# Patient Record
Sex: Male | Born: 1990
Health system: Southern US, Community
[De-identification: ages and names within clinical notes are randomized; demographics above are authoritative.]

## PROBLEM LIST (undated history)

## (undated) DIAGNOSIS — R569 Unspecified convulsions: Secondary | ICD-10-CM

## (undated) HISTORY — PX: FINGER SURGERY: SHX640

## (undated) HISTORY — PX: BRAIN SURGERY: SHX531

---
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
## 2010-06-20 ENCOUNTER — Emergency Department (HOSPITAL_COMMUNITY): Admission: EM | Admit: 2010-06-20 | Discharge: 2010-06-20 | Payer: Self-pay | Admitting: Emergency Medicine

## 2010-06-20 ENCOUNTER — Emergency Department (HOSPITAL_COMMUNITY): Admission: EM | Admit: 2010-06-20 | Discharge: 2010-06-20 | Payer: Self-pay | Admitting: Family Medicine

## 2010-08-30 ENCOUNTER — Emergency Department (HOSPITAL_COMMUNITY): Admission: EM | Admit: 2010-08-30 | Discharge: 2010-08-30 | Payer: Self-pay | Admitting: Emergency Medicine

## 2011-03-10 LAB — VALPROIC ACID LEVEL: Valproic Acid Lvl: 33.9 ug/mL — ABNORMAL LOW (ref 50.0–100.0)

## 2011-03-13 LAB — CBC
Hemoglobin: 13.5 g/dL (ref 13.0–17.0)
MCH: 27.5 pg (ref 26.0–34.0)
MCHC: 32.5 g/dL (ref 30.0–36.0)
MCV: 84.7 fL (ref 78.0–100.0)
RBC: 4.9 MIL/uL (ref 4.22–5.81)
RDW: 12.8 % (ref 11.5–15.5)

## 2011-03-13 LAB — COMPREHENSIVE METABOLIC PANEL
AST: 46 U/L — ABNORMAL HIGH (ref 0–37)
Albumin: 4.2 g/dL (ref 3.5–5.2)
Alkaline Phosphatase: 73 U/L (ref 39–117)
BUN: 16 mg/dL (ref 6–23)
CO2: 30 mEq/L (ref 19–32)
Calcium: 9.8 mg/dL (ref 8.4–10.5)
Chloride: 105 mEq/L (ref 96–112)
Creatinine, Ser: 0.7 mg/dL (ref 0.4–1.5)
GFR calc Af Amer: >60 mL/min (ref >60)
Glucose, Bld: 90 mg/dL (ref 70–99)
Potassium: 3.7 mEq/L (ref 3.5–5.1)
Total Bilirubin: 0.7 mg/dL (ref 0.3–1.2)
Total Protein: 7.9 g/dL (ref 6.0–8.3)

## 2011-03-13 LAB — DIFFERENTIAL
Basophils Absolute: 0 10*3/uL (ref 0.0–0.1)
Monocytes Absolute: 0.5 10*3/uL (ref 0.1–1.0)
Monocytes Relative: 9 % (ref 3–12)
Neutro Abs: 3.5 10*3/uL (ref 1.7–7.7)
Neutrophils Relative %: 70 % (ref 43–77)

## 2011-03-13 LAB — URINALYSIS, ROUTINE W REFLEX MICROSCOPIC
Bilirubin Urine: NEGATIVE
Ketones, ur: NEGATIVE mg/dL
Urobilinogen, UA: 1 mg/dL (ref 0.0–1.0)
pH: 7 (ref 5.0–8.0)

## 2011-03-13 LAB — POCT I-STAT, CHEM 8
Chloride: 104 mEq/L (ref 96–112)
Sodium: 139 mEq/L (ref 135–145)

## 2011-03-13 LAB — LIPASE, BLOOD: Lipase: 23 U/L (ref 11–59)

## 2011-05-10 NOTE — Discharge Summary (Signed)
Joseph [name], Joseph [name] NO.:  [id number]   MEDICAL RECORD NO.:  [id number]          PATIENT TYPE:  OBV   LOCATION:  6151                         FACILITY:  [facility]   PHYSICIAN:  Henrietta Hoover, MD    DATE OF BIRTH:  1991-10-18   DATE OF ADMISSION:  05/11/2008  DATE OF DISCHARGE:  05/12/2008                               DISCHARGE SUMMARY   DISCHARGE DIAGNOSES:  1. Generalized seizure disorder.  2. Asperger syndrome.  3. Attention-deficit hyperactivity disorder.  4. Psoriasis.   REASON FOR HOSPITALIZATION:  Breakthrough seizures.   SIGNIFICANT FINDINGS:  This is a 20 year old male with history of  generalized seizure disorder, who presents with repeat generalized  seizure activity x2.  CMP within normal limits.  CBC with a white blood  cell 7.9, hemoglobin 12.8, platelets 168, neutrophils 82%, and  lymphocytes 14%.  Urine drug screen negative.  Valproic acid level 49.  Of note, the patient has history of positive HIV ELISA, but upon  discussion with PCP, viral load checked was 0 and repeat ELISA was  negative.   TREATMENT:  1. Observation.  2. IV fluids.  3. Consult with Dr. Sharene Skeans.   PROCEDURES:  None.   DISCHARGE MEDICATIONS:  1. Depakote 750 mg b.i.d. p.o.  This is an increase from previous      regimen of 625 b.i.d.  2. Triamcinolone cream.   PENDING ISSUES:  New Depakote level to be checked in a couple of days.   FOLLOWUP:  The patient will follow up with Dr. Sharene Skeans at previously  scheduled appointment on May 15, 2008.   DISCHARGE WEIGHT:  68 kg.   DISCHARGE CONDITION:  Improved and stable.      Pediatrics Resident      Henrietta Hoover, MD  Electronically Signed    PR/MEDQ  D:  05/12/2008  T:  05/13/2008  Job:  [id number]   cc:   Dallie Piles, MD  Deanna Artis. Sharene Skeans, M.D.

## 2011-05-13 NOTE — Discharge Summary (Signed)
Joseph [name], JACOME NO.:  [id number]   MEDICAL RECORD NO.:  [id number]          PATIENT TYPE:  INP   LOCATION:  6121                         FACILITY:  [facility]   PHYSICIAN:  Henrietta Hoover, MD    DATE OF BIRTH:  21-Apr-1991   DATE OF ADMISSION:  12/27/2006  DATE OF DISCHARGE:  12/30/2006                               DISCHARGE SUMMARY   PRIMARY CARE PHYSICIAN:  Dr. Obie Dredge, [facility].   REASON FOR HOSPITALIZATION:  [age]-year-old with seizure disorder,  Asperger, psoriasis, ADHD with a 2 week history of cough, weight loss  and mental status changes.   SIGNIFICANT FINDINGS:  White blood cell count 9.3, hemoglobin on  admission 10.0 and 9.6 at discharge with a hematocrit of 30.3, platelets  419, 89% PMNs on admission, MCV 77.1.  CMP within normal limits, except  for an albumin of 2.5.  Depakote level, on admission is 36, which is  low.  Chest x-ray with left-sided lingular and lower lobe infiltrate.  Ferritin 323, transferrin 180, iron 35, TIBC 222, percent saturation  16%, PPD negative, reticular site 1.4.   TREATMENT:  Ceftriaxone IV for pneumonia.  Diet with double sized  portions.  Multivitamin and continued his home Adderall, Depakote and  triamcinolone.   OPERATION/PROCEDURE:  None.   FINAL DIAGNOSES:  1. Lobar pneumonia.  2. Macrocytic anemia presumptive iron deficiency versus      malnourishment.  3. Malnutrition.  4. Asperger.  5. Psoriasis.  6. Attention deficit hyperactivity disorder.   DISCHARGE MEDICATIONS AND INSTRUCTIONS:  1. Adderall XR 10 mg p.o. daily.  2. Depakote 625 mg p.o. b.i.d.  3. Triamcinolone 0.1% cream 1 application daily.  4. Amoxicillin 500 mg p.o. t.i.d. x8 days (stop January 05, 2007).  5. Ferrous sulfate 325 mg p.o. b.i.d. with meals.   PENDING ISSUES AND RESULTS TO BE FOLLOWED:  Patient will need to have  his macrocytic anemia followed up with a CBC in 2 weeks and potentially  an anemia workup if it is  truly iron deficiency anemia.   FOLLOWUP:  Dr. Obie Dredge at 1:30 p.m. on Monday, January 01, 2007.   DISCHARGE WEIGHT:  52 kilograms.   DISCHARGE CONDITION:  Improved.     ______________________________  Lupita Raider, M.D.    ______________________________  Henrietta Hoover, MD    KS/MEDQ  D:  12/30/2006  T:  12/30/2006  Job:  [id number]   cc:   Dallie Piles, MD

## 2011-09-21 ENCOUNTER — Other Ambulatory Visit: Payer: Self-pay

## 2011-09-21 ENCOUNTER — Emergency Department (HOSPITAL_BASED_OUTPATIENT_CLINIC_OR_DEPARTMENT_OTHER)
Admission: EM | Admit: 2011-09-21 | Discharge: 2011-09-21 | Disposition: A | Discharge status: Home / Self Care | Payer: Medicaid Other | Attending: Emergency Medicine | Admitting: Emergency Medicine

## 2011-09-21 ENCOUNTER — Encounter: Payer: Self-pay | Admitting: *Deleted

## 2011-09-21 DIAGNOSIS — G40909 Epilepsy, unspecified, not intractable, without status epilepticus: Secondary | ICD-10-CM | POA: Insufficient documentation

## 2011-09-21 HISTORY — DX: Unspecified convulsions: R56.9

## 2011-09-21 LAB — COMPREHENSIVE METABOLIC PANEL
AST: 33
Alkaline Phosphatase: 142
BUN: 8
CO2: 25
Calcium: 9
Creatinine, Ser: 0.62
Glucose, Bld: 99
Potassium: 4
Sodium: 135

## 2011-09-21 LAB — DIFFERENTIAL
Basophils Absolute: 0
Eosinophils Absolute: 0
Lymphocytes Relative: 14 — ABNORMAL LOW
Neutrophils Relative %: 82 — ABNORMAL HIGH

## 2011-09-21 LAB — RAPID URINE DRUG SCREEN, HOSP PERFORMED
Barbiturates: NOT DETECTED
Benzodiazepines: NOT DETECTED
Opiates: NOT DETECTED
Tetrahydrocannabinol: NOT DETECTED

## 2011-09-21 LAB — CBC
MCV: 81.3
RDW: 12.4
WBC: 7.9

## 2011-09-21 LAB — VALPROIC ACID LEVEL: Valproic Acid Lvl: 49 — ABNORMAL LOW

## 2011-09-21 MED ORDER — DIVALPROEX SODIUM 125 MG PO CPSP: 125.0000 mg | ORAL_CAPSULE | Freq: Two times a day (BID) | ORAL | Status: DC

## 2011-09-21 MED ORDER — DIVALPROEX SODIUM 250 MG PO DR TAB
250.0000 mg | DELAYED_RELEASE_TABLET | Freq: Once | ORAL | Status: DC
  Filled 2011-09-21: qty 1

## 2011-09-21 MED ORDER — DIVALPROEX SODIUM ER 250 MG PO TB24
250.0000 mg | ORAL_TABLET | Freq: Every day | ORAL | Status: DC
  Administered 2011-09-21: 250 mg via ORAL
  Filled 2011-09-21: qty 1

## 2011-09-21 NOTE — ED Notes (Signed)
I helped patient into gown, I took vitals and ecg., all with difficulty as patient was only concerned with his text messaging until nurse entered room.

## 2011-09-21 NOTE — ED Provider Notes (Signed)
History     CSN: [id number] Arrival date & time: 09/21/2011  9:03 AM  Chief Complaint  Patient presents with  . 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 The patient was apparently at school today and had one witnessed grand mal seizure. No loss of bladder or bowel. Patient is somnolent but arousable to voice and appropriate when answering questions. He does not recall the event. He otherwise states he's been in his normal state of health. He did miss his Depakote dose this morning but states he had had it yesterday and does take it regularly. He takes no other seizure medications per him. He does not hurt at this point in time.  Patient is a 20 y.o. male presenting with seizures.  Seizures  This is a recurrent problem. The current episode started less than 1 hour ago. The problem has been gradually improving. There was 1 seizure. Pertinent negatives include no confusion, no headaches, no chest pain, no cough, no nausea and no vomiting. Characteristics include rhythmic jerking. The episode was witnessed. The seizures did not continue in the ED. Possible causes include missed seizure meds. Possible causes do not include med or dosage change, sleep deprivation, recent illness or change in alcohol use. There has been no fever. There were no medications administered prior to arrival.    Past Medical History  Diagnosis Date  . Seizures     History reviewed. No pertinent past surgical history.  History reviewed. No pertinent family history.  History  Substance Use Topics  . Smoking status: Not on file  . Smokeless tobacco: Not on file  . Alcohol Use: No      Review of Systems  Review of Systems  Constitutional: Negative.  Negative for fever and chills.  HENT: Negative.   Eyes: Negative.  Negative for  discharge and redness.  Respiratory: Negative.  Negative for cough and shortness of breath.   Cardiovascular: Negative.  Negative for chest pain.  Gastrointestinal: Negative.  Negative for nausea, vomiting and abdominal pain.  Genitourinary: Negative.  Negative for hematuria.  Musculoskeletal: Negative.  Negative for back pain.  Skin: Negative.  Negative for color change and rash.  Neurological: Positive for seizures. Negative for syncope and headaches.  Hematological: Negative.  Negative for adenopathy.  Psychiatric/Behavioral: Negative.  Negative for confusion.  All other systems reviewed and are negative.    Allergies  Review of patient's allergies indicates no known allergies.  Home Medications   Current Outpatient Rx  Name Route Sig Dispense Refill  . DIVALPROEX SODIUM 125 MG PO CPSP Oral Take 125 mg by mouth 2 (two) times daily.        Physical Exam    BP 131/59  Pulse 68  Temp(Src) 97.6 F (36.4 C) (Oral)  Resp 20  SpO2 99%  Physical Exam  Constitutional: He is oriented to person, place, and time. He appears well-developed and well-nourished.  Non-toxic appearance. He does not have a sickly appearance.  HENT:  Head: Normocephalic and atraumatic.  Eyes: Conjunctivae, EOM and lids are normal. Pupils are equal, round, and reactive to light.  Neck: Trachea normal, normal range of motion and full passive range of motion without pain. Neck supple.  Cardiovascular: Normal rate, regular rhythm and normal heart sounds.   Pulmonary/Chest: Effort normal and breath sounds normal. No respiratory distress. He has no wheezes. He has no rales. He exhibits  no tenderness.  Abdominal: Soft. Normal appearance. He exhibits no distension. There is no tenderness. There is no rebound and no CVA tenderness.  Musculoskeletal: Normal range of motion.  Neurological: He is alert and oriented to person, place, and time. He has normal strength.       Patient is somnolent but arousable to voice  and appropriate when answering questions  Skin: Skin is warm, dry and intact. No rash noted.  Psychiatric: He has a normal mood and affect. His behavior is normal. Judgment and thought content normal.    ED Course  Procedures (including critical care time)   Labs Reviewed  VALPROIC ACID LEVEL   No results found.    Date: 09/21/2011  Rate: 81  Rhythm: normal sinus rhythm  QRS Axis: normal  Intervals: normal  ST/T Wave abnormalities: normal  Conduction Disutrbances:none  Narrative Interpretation:   Old EKG Reviewed: none available    MDM As patient has a known history of seizures I will check only a Depakote level to see if he is at a therapeutic level. Patient is otherwise alert appropriate and demonstrates no symptoms for infection to indicate meningitis or encephalitis. He is somewhat somnolent which would be expected in a postictal state.     Patient has been found to have a subtherapeutic Depakote level   today. It is unclear that the patient is truly taking his medication daily as directed. I will give him double his normal morning dose to start the loading process. Patient will be discharged with a fresh prescription for the next 2 weeks of this medication as well. As patient is alert and appropriate he'll be able to be discharged home.   Nat Christen, MD 09/21/11 1058

## 2011-09-21 NOTE — ED Notes (Signed)
Pt to room 9 by ems via stretcher, reporting witnessed seizure x 1 at school today. Pt is a/a/ox4, denies any pain or any c/o.

## 2013-11-28 ENCOUNTER — Emergency Department (HOSPITAL_COMMUNITY)
Admission: EM | Admit: 2013-11-28 | Discharge: 2013-11-28 | Disposition: A | Discharge status: Home / Self Care | Payer: Self-pay | Attending: Emergency Medicine | Admitting: Emergency Medicine

## 2013-11-28 ENCOUNTER — Emergency Department (HOSPITAL_COMMUNITY): Payer: Self-pay

## 2013-11-28 ENCOUNTER — Encounter (HOSPITAL_COMMUNITY): Payer: Self-pay | Admitting: Emergency Medicine

## 2013-11-28 DIAGNOSIS — R296 Repeated falls: Secondary | ICD-10-CM | POA: Insufficient documentation

## 2013-11-28 DIAGNOSIS — Y929 Unspecified place or not applicable: Secondary | ICD-10-CM | POA: Insufficient documentation

## 2013-11-28 DIAGNOSIS — R51 Headache: Secondary | ICD-10-CM | POA: Insufficient documentation

## 2013-11-28 DIAGNOSIS — R5381 Other malaise: Secondary | ICD-10-CM | POA: Insufficient documentation

## 2013-11-28 DIAGNOSIS — R569 Unspecified convulsions: Secondary | ICD-10-CM | POA: Insufficient documentation

## 2013-11-28 DIAGNOSIS — Z79899 Other long term (current) drug therapy: Secondary | ICD-10-CM | POA: Insufficient documentation

## 2013-11-28 DIAGNOSIS — R110 Nausea: Secondary | ICD-10-CM | POA: Insufficient documentation

## 2013-11-28 DIAGNOSIS — Y939 Activity, unspecified: Secondary | ICD-10-CM | POA: Insufficient documentation

## 2013-11-28 DIAGNOSIS — IMO0002 Reserved for concepts with insufficient information to code with codable children: Secondary | ICD-10-CM | POA: Insufficient documentation

## 2013-11-28 LAB — CBC WITH DIFFERENTIAL/PLATELET
Basophils Absolute: 0 10*3/uL (ref 0.0–0.1)
Eosinophils Absolute: 0.1 10*3/uL (ref 0.0–0.7)
MCV: 78.9 fL (ref 78.0–100.0)
Monocytes Relative: 7 % (ref 3–12)
Neutro Abs: 5.3 10*3/uL (ref 1.7–7.7)
Neutrophils Relative %: 74 % (ref 43–77)
Platelets: 228 10*3/uL (ref 150–400)

## 2013-11-28 LAB — POCT I-STAT, CHEM 8
BUN: 14 mg/dL (ref 6–23)
Calcium, Ion: 1.26 mmol/L — ABNORMAL HIGH (ref 1.12–1.23)
Creatinine, Ser: 1.1 mg/dL (ref 0.50–1.35)
Glucose, Bld: 87 mg/dL (ref 70–99)
HCT: 45 % (ref 39.0–52.0)
Hemoglobin: 15.3 g/dL (ref 13.0–17.0)
Potassium: 3.7 mEq/L (ref 3.5–5.1)
Sodium: 139 mEq/L (ref 135–145)
TCO2: 28 mmol/L (ref 0–100)

## 2013-11-28 LAB — VALPROIC ACID LEVEL: Valproic Acid Lvl: 64.3 ug/mL (ref 50.0–100.0)

## 2013-11-28 MED ORDER — ONDANSETRON HCL 4 MG/2ML IJ SOLN
4.0000 mg | Freq: Once | INTRAMUSCULAR | Status: AC
  Administered 2013-11-28: 4 mg via INTRAVENOUS
  Filled 2013-11-28: qty 2

## 2013-11-28 MED ORDER — SODIUM CHLORIDE 0.9 % IV BOLUS (SEPSIS)
1000.0000 mL | Freq: Once | INTRAVENOUS | Status: AC
  Administered 2013-11-28: 1000 mL via INTRAVENOUS

## 2013-11-28 NOTE — ED Notes (Addendum)
Spoke with Case manager on call about prescription assistant, case manager will call the pt tomorrow to evaluate if pt eligible for prescription assistant. Walker NP made aware.

## 2013-11-28 NOTE — ED Notes (Signed)
IV line flushed

## 2013-11-28 NOTE — ED Notes (Signed)
Pt walked around nurses station with no complaints. Pt given [place] sandwich and sprite

## 2013-11-28 NOTE — ED Notes (Addendum)
Pt states he tried to stretched his seizure medication out because he does not have money. Walker NP made aware, Dan Humphreys states to involve case Production designer, theatre/television/film, case manager will be notified by Risk manager.

## 2013-11-28 NOTE — ED Notes (Signed)
Pt arrives to ed via [facility] for seizure. Pt arrives with c-collar in place and on LSB.  Pt SP ictal. Caox4, pmsx4, no obvious injury or deformity.pt sts hx of seizures.

## 2013-11-28 NOTE — ED Notes (Signed)
Notified Isidoro Donning, Case Manager

## 2013-11-28 NOTE — ED Provider Notes (Signed)
Medical screening examination/treatment/procedure(s) were conducted as a shared visit with non-physician practitioner(s) and myself.  I personally evaluated the patient during the encounter.  EKG Interpretation   None       22 year old male with history of seizures presenting after a seizure. He states he fell and hit the back of his head. On exam, no head contusions but tenderness to the back of his head. He seemed slightly somnolent, which is likely a residual post ictal symptom.  Plan head CT, monitoring for continued improvement in mental status.  Clinical Impression: 1. Seizure       Candyce Churn, MD 11/29/13 [phone]

## 2013-11-28 NOTE — ED Provider Notes (Signed)
CSN: [id number]     Arrival date & time 11/28/13  1131 History   First MD Initiated Contact with Patient 11/28/13 1215     Chief Complaint  Patient presents with  . Seizures   (Consider location/radiation/quality/duration/timing/severity/associated sxs/prior Treatment) Patient is a 22 y.o. male presenting with seizures. The history is provided by the EMS personnel and the patient. No language interpreter was used.  Seizures Seizure activity on arrival: no   Postictal symptoms: somnolence    Pt is a 22 year old male who presents via [facility] after having a seizure today. Unsure if witnessed, pt doesn't recall details. Post-ictal and lethargic on arrival. No obvious injury or deformity, pt thinks he might have fallen. He reports a dull headache and some mild nausea. Denies back pain, neck pain or pain in his extremities. Denies any recent illness, fever or chills.   Past Medical History  Diagnosis Date  . Seizures    History reviewed. No pertinent past surgical history. History reviewed. No pertinent family history. History  Substance Use Topics  . Smoking status: Never Smoker   . Smokeless tobacco: Not on file  . Alcohol Use: No    Review of Systems  Constitutional: Negative for fever, chills and fatigue.  Respiratory: Negative for shortness of breath.   Cardiovascular: Negative for chest pain.  Gastrointestinal: Negative for abdominal pain.  Neurological: Positive for seizures and headaches. Negative for tremors, speech difficulty and numbness.  All other systems reviewed and are negative.    Allergies  Shellfish allergy  Home Medications   Current Outpatient Rx  Name  Route  Sig  Dispense  Refill  . divalproex (DEPAKOTE SPRINKLE) 125 MG capsule   Oral   Take 125 mg by mouth 2 (two) times daily.           Marland Kitchen EXPIRED: divalproex (DEPAKOTE SPRINKLES) 125 MG capsule   Oral   Take 1 capsule (125 mg total) by mouth 2 (two) times daily.   28 capsule   0    BP  134/47  Pulse 69  Temp(Src) 98.3 F (36.8 C) (Oral)  Resp 20  SpO2 100% Physical Exam  Nursing note and vitals reviewed. Constitutional: He is oriented to person, place, and time. He appears well-developed and well-nourished. He appears lethargic. No distress.  HENT:  Head: Normocephalic and atraumatic.  Mouth/Throat: Oropharynx is clear and moist.  Eyes: Conjunctivae and EOM are normal. Pupils are equal, round, and reactive to light.  Neck: Normal range of motion. Neck supple. No JVD present. No tracheal deviation present. No thyromegaly present.  Cardiovascular: Normal rate, regular rhythm, normal heart sounds and intact distal pulses.   Pulmonary/Chest: Effort normal and breath sounds normal. No respiratory distress. He has no wheezes.  Abdominal: Soft. Bowel sounds are normal. He exhibits no distension. There is no tenderness. There is no rebound and no guarding.  Musculoskeletal: Normal range of motion.  Lymphadenopathy:    He has no cervical adenopathy.  Neurological: He is oriented to person, place, and time. He has normal strength. He appears lethargic. No sensory deficit. GCS eye subscore is 4. GCS verbal subscore is 5. GCS motor subscore is 6.  Lethargic but responds appropriately and answers questions.   Skin: Skin is warm and dry.  Psychiatric: He has a normal mood and affect. His behavior is normal.    ED Course  Procedures (including critical care time) Labs Review Labs Reviewed  CBC WITH DIFFERENTIAL - Abnormal; Notable for the following:  MCH 25.7 (*)    All other components within normal limits  POCT I-STAT, CHEM 8 - Abnormal; Notable for the following:    Calcium, Ion 1.26 (*)    All other components within normal limits  VALPROIC ACID LEVEL   Imaging Review No results found.  EKG Interpretation   None       MDM   1. Seizure    Head CT no focal abnormality. Neuro exam intact. Complete recovery s/p seizure. Depakote level was therapeutic. Referred  to social worker for assistance with medications. Ambulatory in unit and eating and drinking. Pt encouraged to go home and rest and stay well hydrated today. Reminded to take medications with consistency. Pt feels ok to go home. Stable for discharge.     Irish Elders, NP 12/07/13 [phone]

## 2013-11-29 NOTE — Progress Notes (Signed)
   CARE MANAGEMENT NOTE 11/29/2013  Patient:  Joseph [name], Joseph [name]   Account Number:  [id number]  Date Initiated:  11/29/2013  Documentation initiated by:  [facility]  Subjective/Objective Assessment:   Seizures     Action/Plan:   Anticipated DC Date:  11/28/2013   Anticipated DC Plan:  HOME/SELF CARE      DC Planning Services  CM consult  Medication Assistance      Choice offered to / List presented to:             Status of service:  Completed, signed off Medicare Important Message given?   (If response is "NO", the following Medicare IM given date fields will be blank) Date Medicare IM given:   Date Additional Medicare IM given:    Discharge Disposition:  HOME/SELF CARE  Per UR Regulation:    If discussed at Long Length of Stay Meetings, dates discussed:    Comments:  11/29/2013 1000 NCM attempted call to pt's cell phone. Phone is currently not working. Contacted pt's home phone. Left message with person answering phone. States she will give pt my number to return call. Will verify with pt that he no longer has Medicaid. Did not see in notes Rx for medications. Isidoro Donning RN CCM Case Mgmt phone [phone]

## 2013-12-07 NOTE — ED Provider Notes (Addendum)
Medical screening examination/treatment/procedure(s) were conducted as a shared visit with non-physician practitioner(s) and myself.  I personally evaluated the patient during the encounter.   Please see my separate note.      Candyce Churn, MD 12/07/13 1212  Candyce Churn, MD 12/07/13 [phone]

## 2017-08-21 ENCOUNTER — Encounter (HOSPITAL_COMMUNITY): Payer: Self-pay | Admitting: Emergency Medicine

## 2017-08-21 ENCOUNTER — Emergency Department (HOSPITAL_COMMUNITY)
Admission: EM | Admit: 2017-08-21 | Discharge: 2017-08-21 | Disposition: A | Discharge status: Home / Self Care | Payer: Medicaid Other | Attending: Emergency Medicine | Admitting: Emergency Medicine

## 2017-08-21 DIAGNOSIS — Z79899 Other long term (current) drug therapy: Secondary | ICD-10-CM | POA: Insufficient documentation

## 2017-08-21 DIAGNOSIS — R569 Unspecified convulsions: Secondary | ICD-10-CM | POA: Insufficient documentation

## 2017-08-21 LAB — VALPROIC ACID LEVEL: Valproic Acid Lvl: 65 ug/mL (ref 50.0–100.0)

## 2017-08-21 MED ORDER — LEVETIRACETAM 500 MG PO TABS: 500.0000 mg | ORAL_TABLET | Freq: Two times a day (BID) | ORAL | 0 refills | Status: DC

## 2017-08-21 MED ORDER — LEVETIRACETAM 500 MG/5ML IV SOLN
1000.0000 mg | Freq: Once | INTRAVENOUS | Status: AC
  Administered 2017-08-21: 1000 mg via INTRAVENOUS
  Filled 2017-08-21: qty 10

## 2017-08-21 NOTE — ED Notes (Signed)
Bed: WA08 Expected date:  Expected time:  Means of arrival:  Comments: Ems-sz

## 2017-08-21 NOTE — ED Triage Notes (Signed)
Per EMS, patient from a parking lot, had witnessed seizure lasting approximately six minutes. Patient post ictal upon EMS arrival. Hx seizures.   18g L AC  BP 134/95 HR 90 RR 16 O2 96

## 2017-08-21 NOTE — ED Provider Notes (Signed)
[facility]-EMERGENCY DEPT Provider Note   CSN: [id number] Arrival date & time: 08/21/17  1630     History   Chief Complaint Chief Complaint  Patient presents with  . Seizures    HPI Joseph [name] is a 26 y.o. male.  HPI  26 y.o. male with a hx of Seizures, presents to the Emergency Department today via EMS due to witnessed seizure in parking lot PTA. This lasted approximately 6 minutes. Pt was post ictal on EMS arrival. Last seizure x 2 months ago. Pt takes Depakote 250mg  BID. Pt was told during last episode that he should increase his dosing, but patient refused due to not liking the way it makes him feel. Does not have a neurologist. Denies headaches. No numbness/tingling. No N/V. No CP/SOB/ABD pain. No visual changes. No cough/congestion. No other symptoms noted.   Past Medical History:  Diagnosis Date  . Seizures (HCC)     There are no active problems to display for this patient.   History reviewed. No pertinent surgical history.     Home Medications    Prior to Admission medications   Medication Sig Start Date End Date Taking? Authorizing Provider  divalproex (DEPAKOTE SPRINKLE) 125 MG capsule Take 250 mg by mouth 2 (two) times daily.    Yes [provider]  triamcinolone (KENALOG) 0.025 % cream Apply 1 application topically 2 (two) times daily.   Yes [provider]  divalproex (DEPAKOTE SPRINKLES) 125 MG capsule Take 1 capsule (125 mg total) by mouth 2 (two) times daily. 09/21/11 09/20/12  Emeline General, MD    Family History No family history on file.  Social History Social History  Substance Use Topics  . Smoking status: Never Smoker  . Smokeless tobacco: Not on file  . Alcohol use No     Allergies   Dust mite extract and Shellfish allergy   Review of Systems Review of Systems ROS reviewed and all are negative for acute change except as noted in the HPI.  Physical Exam Updated Vital Signs BP 135/63 (BP Location: Left Arm)   Pulse  80   Resp 14   SpO2 98%   Physical Exam  Constitutional: He is oriented to person, place, and time. Vital signs are normal. He appears well-developed and well-nourished.  HENT:  Head: Normocephalic and atraumatic.  Right Ear: Hearing normal.  Left Ear: Hearing normal.  Eyes: Pupils are equal, round, and reactive to light. Conjunctivae and EOM are normal.  Neck: Normal range of motion. Neck supple.  Cardiovascular: Normal rate, regular rhythm, normal heart sounds and intact distal pulses.   Pulmonary/Chest: Effort normal and breath sounds normal.  Musculoskeletal: Normal range of motion.  Neurological: He is alert and oriented to person, place, and time. He has normal strength. No cranial nerve deficit or sensory deficit.  Cranial Nerves:  II: Pupils equal, round, reactive to light III,IV, VI: ptosis not present, extra-ocular motions intact bilaterally  V,VII: smile symmetric, facial light touch sensation equal VIII: hearing grossly normal bilaterally  IX,X: midline uvula rise  XI: bilateral shoulder shrug equal and strong XII: midline tongue extension  Skin: Skin is warm and dry.  Psychiatric: He has a normal mood and affect. His speech is normal and behavior is normal. Thought content normal.  Nursing note and vitals reviewed.    ED Treatments / Results  Labs (all labs ordered are listed, but only abnormal results are displayed) Labs Reviewed  VALPROIC ACID LEVEL    EKG  EKG Interpretation None  Radiology No results found.  Procedures Procedures (including critical care time)  Medications Ordered in ED Medications  levETIRAcetam (KEPPRA) 1,000 mg in sodium chloride 0.9 % 100 mL IVPB (1,000 mg Intravenous New Bag/Given 08/21/17 1833)     Initial Impression / Assessment and Plan / ED Course  I have reviewed the triage vital signs and the nursing notes.  Pertinent labs & imaging results that were available during my care of the patient were reviewed by me  and considered in my medical decision making (see chart for details).  Final Clinical Impressions(s) / ED Diagnoses  {I have reviewed and evaluated the relevant laboratory values.   {I have reviewed the relevant previous healthcare records.  {I obtained HPI from historian. {Patient discussed with supervising physician.  ED Course:  Assessment: Pt is a 26 y.o. male with a hx of Seizures, presents to the Emergency Department today via EMS due to witnessed seizure in parking lot PTA. This lasted approximately 6 minutes. Pt was post ictal on EMS arrival. Last seizure x 2 months ago. Pt takes Depakote 250mg  BID. Pt was told during last episode that he should increase his dosing, but patient refused due to not liking the way it makes him feel. Does not have a neurologist. Denies headaches. No numbness/tingling. No N/V. No CP/SOB/ABD pain. No visual changes. No cough/congestion. On exam, pt in NAD. Nontoxic/nonseptic appearing. VSS. Afebrile. Lungs CTA. Heart RRR. Abdomen nontender soft. CN evaluated and unremarkable. Depakote Level 65. Given Keppra 1g Load in ED. Plan is to DC home with follow up to Neurology. Will Rx keppra 500mg  BID. Ambulatory referral to neurology given. At time of discharge, Patient is in no acute distress. Vital Signs are stable. Patient is able to ambulate. Patient able to tolerate PO.   Disposition/Plan:  DC Home Additional Verbal discharge instructions given and discussed with patient.  Pt Instructed to f/u with neurology in the next week for evaluation and treatment of symptoms. Return precautions given Pt acknowledges and agrees with plan  Supervising Physician Doug Sou, MD  Final diagnoses:  Seizure [facility])    New Prescriptions New Prescriptions   No medications on file     Wilber Bihari 08/21/17 1836    Doug Sou, MD 08/21/17 2227

## 2017-08-21 NOTE — ED Provider Notes (Addendum)
Patient reportedly had a generalized seizure earlier today. DENIES NONCOMPLIANCE WITH MEDICATION HOWEVER REPORTEDLY HE DOES NOT LIKE THE WAY DEPAKOTE MAKES HIM FEEL. ON EXAM PATIENT IS SLEEPY AROUSABLE TO VERBAL STIMULUS. MOVES ALL EXTREMITIES. [place]asc[place] SCORE 14    Doug Sou, MD 08/21/17 Guadlupe Spanish, MD 08/21/17 2227

## 2017-08-21 NOTE — Discharge Instructions (Signed)
Please read and follow all provided instructions.  Your diagnoses today include:  1. Seizure (HCC)     Tests performed today include: Vital signs. See below for your results today.   Medications prescribed:  Take as prescribed   Home care instructions:  Follow any educational materials contained in this packet.  Follow-up instructions: Please follow-up with your neurology for further evaluation of symptoms and treatment   Return instructions:  Please return to the Emergency Department if you do not get better, if you get worse, or new symptoms OR  - Fever (temperature greater than 101.9F)  - Bleeding that does not stop with holding pressure to the area    -Severe pain (please note that you may be more sore the day after your accident)  - Chest Pain  - Difficulty breathing  - Severe nausea or vomiting  - Inability to tolerate food and liquids  - Passing out  - Skin becoming red around your wounds  - Change in mental status (confusion or lethargy)  - New numbness or weakness    Please return if you have any other emergent concerns.  Additional Information:  Your vital signs today were: BP 135/63 (BP Location: Left Arm)    Pulse 80    Resp 14    SpO2 98%  If your blood pressure (BP) was elevated above 135/85 this visit, please have this repeated by your doctor within one month. ---------------

## 2017-08-21 NOTE — ED Notes (Signed)
IV removed from LAC 

## 2018-01-26 ENCOUNTER — Emergency Department (HOSPITAL_COMMUNITY)
Admission: EM | Admit: 2018-01-26 | Discharge: 2018-01-26 | Disposition: A | Discharge status: Home / Self Care | Payer: Self-pay | Attending: Emergency Medicine | Admitting: Emergency Medicine

## 2018-01-26 ENCOUNTER — Emergency Department (HOSPITAL_COMMUNITY): Payer: Self-pay

## 2018-01-26 ENCOUNTER — Encounter (HOSPITAL_COMMUNITY): Payer: Self-pay

## 2018-01-26 ENCOUNTER — Other Ambulatory Visit: Payer: Self-pay

## 2018-01-26 DIAGNOSIS — Z79899 Other long term (current) drug therapy: Secondary | ICD-10-CM | POA: Insufficient documentation

## 2018-01-26 DIAGNOSIS — R569 Unspecified convulsions: Secondary | ICD-10-CM | POA: Insufficient documentation

## 2018-01-26 DIAGNOSIS — M79642 Pain in left hand: Secondary | ICD-10-CM | POA: Insufficient documentation

## 2018-01-26 LAB — VALPROIC ACID LEVEL: Valproic Acid Lvl: 86 ug/mL (ref 50.0–100.0)

## 2018-01-26 LAB — COMPREHENSIVE METABOLIC PANEL
ALT: 22 U/L (ref 17–63)
AST: 23 U/L (ref 15–41)
Albumin: 3.9 g/dL (ref 3.5–5.0)
Alkaline Phosphatase: 50 U/L (ref 38–126)
Anion gap: 8 (ref 5–15)
BUN: 16 mg/dL (ref 6–20)
CO2: 28 mmol/L (ref 22–32)
CREATININE: 0.86 mg/dL (ref 0.61–1.24)
Calcium: 9.1 mg/dL (ref 8.9–10.3)
Chloride: 101 mmol/L (ref 101–111)
GFR calc non Af Amer: >60 mL/min (ref >60)
GLUCOSE: 92 mg/dL (ref 65–99)
Potassium: 4.1 mmol/L (ref 3.5–5.1)
SODIUM: 137 mmol/L (ref 135–145)
Total Bilirubin: 0.6 mg/dL (ref 0.3–1.2)
Total Protein: 7.3 g/dL (ref 6.5–8.1)

## 2018-01-26 NOTE — ED Notes (Signed)
Pt is alert and oriented x 4 and is verbally responsive. Pt appears sleepy. Pt is inquiring if employer was contacted as pt wants to return back to work. MD inquiry to commence.

## 2018-01-26 NOTE — ED Provider Notes (Signed)
[facility]-EMERGENCY DEPT Provider Note   CSN: [id number] Arrival date & time: 01/26/18  0941     History   Chief Complaint Chief Complaint  Patient presents with  . Seizures    HPI Joseph [name] is a 27 y.o. male.  HPI Patient is a 27 year old male with a known history of seizures for which he takes Depakote.  He states compliance with his seizures but had a witnessed seizure today.  He was found to be postictal by EMS and was brought to the emergency department.  On arrival to the emergency department the patient's mental status is clear.  He reports mild pain in left middle finger MCP joint without significant swelling.  Denies headache.  Denies neck pain.  No chest or abdominal pain.  Denies back pain.  No recent illness.  Symptoms are mild in severity.   Past Medical History:  Diagnosis Date  . Seizures (HCC)     There are no active problems to display for this patient.   History reviewed. No pertinent surgical history.     Home Medications    Prior to Admission medications   Medication Sig Start Date End Date Taking? Authorizing Provider  levETIRAcetam (KEPPRA) 500 MG tablet Take 1 tablet (500 mg total) by mouth 2 (two) times daily. 08/21/17  Yes Audry Pili, PA-C  divalproex (DEPAKOTE SPRINKLES) 125 MG capsule Take 1 capsule (125 mg total) by mouth 2 (two) times daily. Patient not taking: Reported on 01/26/2018 09/21/11 09/20/12  Emeline General, MD    Family History History reviewed. No pertinent family history.  Social History Social History   Tobacco Use  . Smoking status: Never Smoker  . Smokeless tobacco: Never Used  Substance Use Topics  . Alcohol use: No  . Drug use: No     Allergies   Dust mite extract; Shellfish allergy; and Tomato   Review of Systems Review of Systems  All other systems reviewed and are negative.    Physical Exam Updated Vital Signs Ht 6\' 5"  (1.956 m)   Wt 90.7 kg (200 lb)   SpO2 100%   BMI  23.72 kg/m   Physical Exam  Constitutional: He is oriented to person, place, and time. He appears well-developed and well-nourished.  HENT:  Head: Normocephalic and atraumatic.  Eyes: EOM are normal. Pupils are equal, round, and reactive to light.  Neck: Normal range of motion.  Cardiovascular: Normal rate and regular rhythm.  Pulmonary/Chest: Effort normal.  Abdominal: Soft. He exhibits no distension.  Musculoskeletal: Normal range of motion.  Mild tenderness left middle finger MCP joint without obvious deformity.  No swelling or erythema.  Full range of motion of the left middle finger.  Normal flexion and extension.  Neurological: He is alert and oriented to person, place, and time.  5/5 strength in major muscle groups of  bilateral upper and lower extremities. Speech normal. No facial asymetry.   Skin: Skin is warm.  Psychiatric: He has a normal mood and affect.  Nursing note and vitals reviewed.    ED Treatments / Results  Labs (all labs ordered are listed, but only abnormal results are displayed) Labs Reviewed  VALPROIC ACID LEVEL  COMPREHENSIVE METABOLIC PANEL    EKG  EKG Interpretation None       Radiology Dg Hand Complete Left  Result Date: 01/26/2018 CLINICAL DATA:  Seizure this morning with left hand and index finger pain. EXAM: LEFT HAND - COMPLETE 3+ VIEW COMPARISON:  None. FINDINGS: Overlap of fingers  on the lateral view. Given this limitation, no fracture or dislocation identified. IMPRESSION: No acute osseous abnormality. Electronically Signed   By: Jeronimo GreavesKyle  Talbot M.D.   On: 01/26/2018 10:43    Procedures Procedures (including critical care time)  Medications Ordered in ED Medications - No data to display   Initial Impression / Assessment and Plan / ED Course  I have reviewed the triage vital signs and the nursing notes.  Pertinent labs & imaging results that were available during my care of the patient were reviewed by me and considered in my medical  decision making (see chart for details).     Overall well-appearing.  No longer postictal.  Mental status is cleared.  Depakote level is therapeutic.  Outpatient primary care and neurology follow-up.  Patient understands return to the ER for new or worsening symptoms.  Final Clinical Impressions(s) / ED Diagnoses   Final diagnoses:  None    ED Discharge Orders    None       Azalia Bilisampos, Audrielle Vankuren, MD 01/26/18 1053

## 2018-01-26 NOTE — ED Triage Notes (Signed)
Patient brought in by EMS with witnessed seizure lasting approx 30 seconds, causing the patient to fall off the toilet, with post-ictal phase en route. Patient placed in C-collar by EMS for fall. 18G Left AC PIV placed by EMS. Patient Alert and oriented x4 in triage. EDMD at bedside- C-collar removed by EDMD.

## 2018-01-26 NOTE — ED Notes (Signed)
Bed: WA06 Expected date:  Expected time:  Means of arrival:  Comments: 27 yo seizure w/hx of the same

## 2018-01-26 NOTE — Discharge Instructions (Signed)
Please call your neurologist for follow up °

## 2019-07-22 ENCOUNTER — Other Ambulatory Visit: Payer: Self-pay

## 2019-07-22 ENCOUNTER — Emergency Department (HOSPITAL_COMMUNITY): Payer: Self-pay

## 2019-07-22 ENCOUNTER — Observation Stay (HOSPITAL_COMMUNITY)
Admission: EM | Admit: 2019-07-22 | Discharge: 2019-07-23 | Disposition: A | Discharge status: Home / Self Care | Payer: Self-pay | Attending: Family Medicine | Admitting: Family Medicine

## 2019-07-22 DIAGNOSIS — R569 Unspecified convulsions: Principal | ICD-10-CM | POA: Insufficient documentation

## 2019-07-22 DIAGNOSIS — E162 Hypoglycemia, unspecified: Secondary | ICD-10-CM | POA: Diagnosis present

## 2019-07-22 DIAGNOSIS — D649 Anemia, unspecified: Secondary | ICD-10-CM | POA: Diagnosis present

## 2019-07-22 DIAGNOSIS — Z79899 Other long term (current) drug therapy: Secondary | ICD-10-CM | POA: Insufficient documentation

## 2019-07-22 DIAGNOSIS — G40919 Epilepsy, unspecified, intractable, without status epilepticus: Secondary | ICD-10-CM

## 2019-07-22 DIAGNOSIS — Z20828 Contact with and (suspected) exposure to other viral communicable diseases: Secondary | ICD-10-CM | POA: Insufficient documentation

## 2019-07-22 DIAGNOSIS — Z114 Encounter for screening for human immunodeficiency virus [HIV]: Secondary | ICD-10-CM

## 2019-07-22 LAB — CBG MONITORING, ED
Glucose-Capillary: 48 mg/dL — ABNORMAL LOW (ref 70–99)
Glucose-Capillary: 59 mg/dL — ABNORMAL LOW (ref 70–99)
Glucose-Capillary: 184 mg/dL — ABNORMAL HIGH (ref 70–99)
Glucose-Capillary: 29 mg/dL — CL (ref 70–99)
Glucose-Capillary: 46 mg/dL — ABNORMAL LOW (ref 70–99)
Glucose-Capillary: 32 mg/dL — CL (ref 70–99)
Glucose-Capillary: 40 mg/dL — CL (ref 70–99)
Glucose-Capillary: 97 mg/dL (ref 70–99)
Glucose-Capillary: 81 mg/dL (ref 70–99)
Glucose-Capillary: 84 mg/dL (ref 70–99)

## 2019-07-22 LAB — VALPROIC ACID LEVEL: Valproic Acid Lvl: 21 ug/mL — ABNORMAL LOW (ref 50.0–100.0)

## 2019-07-22 LAB — CBC
HCT: 38.8 % — ABNORMAL LOW (ref 39.0–52.0)
Hemoglobin: 11.8 g/dL — ABNORMAL LOW (ref 13.0–17.0)
MCH: 24.8 pg — ABNORMAL LOW (ref 26.0–34.0)
MCHC: 30.4 g/dL (ref 30.0–36.0)
MCV: 81.5 fL (ref 80.0–100.0)
Platelets: 196 10*3/uL (ref 150–400)
RBC: 4.76 MIL/uL (ref 4.22–5.81)
RDW: 11.9 % (ref 11.5–15.5)
WBC: 4.3 10*3/uL (ref 4.0–10.5)
nRBC: 0 % (ref 0.0–0.2)

## 2019-07-22 LAB — BASIC METABOLIC PANEL
Anion gap: 10 (ref 5–15)
BUN: 9 mg/dL (ref 6–20)
CO2: 27 mmol/L (ref 22–32)
Calcium: 9.6 mg/dL (ref 8.9–10.3)
Chloride: 101 mmol/L (ref 98–111)
Creatinine, Ser: 0.82 mg/dL (ref 0.61–1.24)
GFR calc Af Amer: >60 mL/min (ref >60)
GFR calc non Af Amer: >60 mL/min (ref >60)
Glucose, Bld: 67 mg/dL — ABNORMAL LOW (ref 70–99)
Potassium: 3.7 mmol/L (ref 3.5–5.1)
Sodium: 138 mmol/L (ref 135–145)

## 2019-07-22 MED ORDER — SODIUM CHLORIDE 0.9% FLUSH: 3.0000 mL | INTRAVENOUS | Status: DC | PRN

## 2019-07-22 MED ORDER — SODIUM CHLORIDE 0.9% FLUSH
3.0000 mL | Freq: Two times a day (BID) | INTRAVENOUS | Status: DC
  Administered 2019-07-22 – 2019-07-23 (×2): 3 mL via INTRAVENOUS

## 2019-07-22 MED ORDER — ENOXAPARIN SODIUM 40 MG/0.4ML ~~LOC~~ SOLN
40.0000 mg | SUBCUTANEOUS | Status: DC
  Administered 2019-07-23: 40 mg via SUBCUTANEOUS
  Filled 2019-07-22: qty 0.4

## 2019-07-22 MED ORDER — GLUCOSE 40 % PO GEL
1.0000 | Freq: Once | ORAL | Status: AC
  Administered 2019-07-22: 37.5 g via ORAL
  Filled 2019-07-22: qty 1

## 2019-07-22 MED ORDER — DEXTROSE 50 % IV SOLN
1.0000 | Freq: Once | INTRAVENOUS | Status: AC
  Administered 2019-07-22: 50 mL via INTRAVENOUS
  Filled 2019-07-22: qty 50

## 2019-07-22 MED ORDER — SODIUM CHLORIDE 0.9 % IV BOLUS
1000.0000 mL | Freq: Once | INTRAVENOUS | Status: AC
  Administered 2019-07-22: 20:00:00 1000 mL via INTRAVENOUS

## 2019-07-22 MED ORDER — SODIUM CHLORIDE 0.9 % IV SOLN: 250.0000 mL | INTRAVENOUS | Status: DC | PRN

## 2019-07-22 MED ORDER — SODIUM CHLORIDE 0.9 % IV SOLN
75.0000 mL/h | INTRAVENOUS | Status: DC
  Administered 2019-07-23: 75 mL/h via INTRAVENOUS

## 2019-07-22 MED ORDER — LORAZEPAM 2 MG/ML IJ SOLN: 1.0000 mg | INTRAMUSCULAR | Status: DC | PRN

## 2019-07-22 MED ORDER — DEXTROSE 5 % IV SOLN
INTRAVENOUS | Status: DC
  Administered 2019-07-23: via INTRAVENOUS

## 2019-07-22 MED ORDER — DEXTROSE 50 % IV SOLN
INTRAVENOUS | Status: AC
  Administered 2019-07-22: 50 mL
  Filled 2019-07-22: qty 50

## 2019-07-22 MED ORDER — VALPROATE SODIUM 500 MG/5ML IV SOLN
1000.0000 mg | Freq: Once | INTRAVENOUS | Status: AC
  Administered 2019-07-22: 1000 mg via INTRAVENOUS
  Filled 2019-07-22: qty 10

## 2019-07-22 MED ORDER — DIVALPROEX SODIUM ER 500 MG PO TB24
500.0000 mg | ORAL_TABLET | Freq: Two times a day (BID) | ORAL | Status: DC
  Administered 2019-07-23: 500 mg via ORAL
  Filled 2019-07-22: qty 1

## 2019-07-22 NOTE — ED Notes (Signed)
Report attempted 

## 2019-07-22 NOTE — ED Notes (Signed)
PA, Khatri notified of patient blood glucose level. At patients bedside at this time.

## 2019-07-22 NOTE — ED Provider Notes (Signed)
  Physical Exam  BP 127/74   Pulse 61   Temp (!) 97.4 F (36.3 C) (Oral)   Resp 12   SpO2 100%   Physical Exam Vitals signs and nursing note reviewed.  Constitutional:      General: He is not in acute distress.    Appearance: He is well-developed. He is not diaphoretic.  HENT:     Head: Normocephalic and atraumatic.  Eyes:     General: No scleral icterus.    Conjunctiva/sclera: Conjunctivae normal.  Neck:     Musculoskeletal: Normal range of motion.  Pulmonary:     Effort: Pulmonary effort is normal. No respiratory distress.  Skin:    Findings: No rash.  Neurological:     Mental Status: He is alert.     ED Course/Procedures     Procedures  MDM  Care handed off from previous provider PA Leyden.  Please see their note for further detail.  Briefly, patient with a past medical history of seizures on Divalproex presents to ED for seizure that occurred prior to arrival.  States he missed his medication this morning and had a seizure at work.  Valproic acid level was low and was repleted through IV loading dose.  Imaging unremarkable.  Labs significant for hypo glycemia at 43.  Plan is to recheck CBG.  11:12 PM Patient ate 3 slices of pizza, drink 2 cups of orange juice but CBG dropped to 30.  This was checked twice through IV and fingerstick.  He was given another amp of D50 with improvement in blood sugar to 97.  After recheck, dropped again to 81.  Due to multiple amps of D50 and persistent hypoglycemia, feel that patient will benefit from admission.  Will call hospitalist for admission.        Delia Heady, PA-C 07/22/19 2314    Dorie Rank, MD 07/26/19 [phone]

## 2019-07-22 NOTE — ED Provider Notes (Addendum)
[facility] Provider Note   CSN: [id number] Arrival date & time: 07/22/19  1508    History   Chief Complaint Chief Complaint  Patient presents with   Seizures    HPI Joseph [name] is a 28 y.o. male past history of seizures who is currently on Divalproex presents for evaluation of seizure activity.  He reports that he was making the bed and had a seizure seizure.  Per EMS, patient works at a [employer] and coworker found him on the ground shaking and foaming at the mouth.  EMS arrival, noted that he was postictal until about 1430.  Patient reports he has a history of seizures.  He is currently on Depakote.  He states that he missed his Depakote dose this morning.  He states that he has not been sick recently denies any fevers.  He states he feels tired but otherwise no complaints at this time.  He denies any vision changes, numbness/weakness of his arms or legs, difficulty breathing.  He has seen a neurologist previously but does not remember the name.      The history is provided by the patient.    Past Medical History:  Diagnosis Date   Seizures [facility](HCC)     Patient Active Problem List   Diagnosis Date Noted   Hypoglycemia without diagnosis of diabetes mellitus 07/23/2019   Normocytic anemia 07/23/2019   Encounter for screening for HIV 07/23/2019   Breakthrough seizure (HCC) 07/22/2019    Past Surgical History:  Procedure Laterality Date   FINGER SURGERY          Home Medications    Prior to Admission medications   Medication Sig Start Date End Date Taking? Authorizing Provider  divalproex (DEPAKOTE ER) 500 MG 24 hr tablet Take 500 mg by mouth 2 (two) times a day.   Yes [provider]    Family History History reviewed. No pertinent family history.  Social History Social History   Tobacco Use   Smoking status: Never Smoker   Smokeless tobacco: Never Used  Substance Use Topics   Alcohol use: No     Drug use: No     Allergies   Mushroom extract complex, Dust mite extract, Shellfish allergy, and Tomato   Review of Systems Review of Systems  Constitutional: Negative for fever.  Respiratory: Negative for cough and shortness of breath.   Cardiovascular: Negative for chest pain.  Gastrointestinal: Negative for abdominal pain, nausea and vomiting.  Genitourinary: Negative for dysuria and hematuria.  Neurological: Positive for seizures and headaches. Negative for weakness and numbness.  All other systems reviewed and are negative.    Physical Exam Updated Vital Signs BP (!) 108/48 (BP Location: Right Arm)    Pulse (!) 58    Temp (!) 97.4 F (36.3 C) (Axillary)    Resp 18    SpO2 98%   Physical Exam Vitals signs and nursing note reviewed.  Constitutional:      Appearance: Normal appearance. He is well-developed.  HENT:     Head: Normocephalic and atraumatic.     Comments: Hematoma noted to posterior aspect of the head. Eyes:     General: Lids are normal.     Conjunctiva/sclera: Conjunctivae normal.     Pupils: Pupils are equal, round, and reactive to light.     Comments: PERRL. No nystagmus, no neglect.   Neck:     Musculoskeletal: Full passive range of motion without pain.  Cardiovascular:  Rate and Rhythm: Normal rate and regular rhythm.     Pulses: Normal pulses.     Heart sounds: Normal heart sounds. No murmur. No friction rub. No gallop.   Pulmonary:     Effort: Pulmonary effort is normal.     Breath sounds: Normal breath sounds.  Abdominal:     Palpations: Abdomen is soft. Abdomen is not rigid.     Tenderness: There is no abdominal tenderness. There is no guarding.  Musculoskeletal: Normal range of motion.  Skin:    General: Skin is warm and dry.     Capillary Refill: Capillary refill takes less than 2 seconds.  Neurological:     Mental Status: He is alert and oriented to person, place, and time.     Comments: Cranial nerves III-XII intact Follows  commands, Moves all extremities  5/5 strength to BUE and BLE  Sensation intact throughout all major nerve distributions Normal coordination  No slurred speech. No facial droop.   Psychiatric:        Speech: Speech normal.      ED Treatments / Results  Labs (all labs ordered are listed, but only abnormal results are displayed) Labs Reviewed  BASIC METABOLIC PANEL - Abnormal; Notable for the following components:      Result Value   Glucose, Bld 67 (*)    All other components within normal limits  CBC - Abnormal; Notable for the following components:   Hemoglobin 11.8 (*)    HCT 38.8 (*)    MCH 24.8 (*)    All other components within normal limits  VALPROIC ACID LEVEL - Abnormal; Notable for the following components:   Valproic Acid Lvl 21 (*)    All other components within normal limits  IRON AND TIBC - Abnormal; Notable for the following components:   Iron 18 (*)    TIBC 248 (*)    Saturation Ratios 7 (*)    All other components within normal limits  GLUCOSE, CAPILLARY - Abnormal; Notable for the following components:   Glucose-Capillary 106 (*)    All other components within normal limits  GLUCOSE, CAPILLARY - Abnormal; Notable for the following components:   Glucose-Capillary 100 (*)    All other components within normal limits  GLUCOSE, CAPILLARY - Abnormal; Notable for the following components:   Glucose-Capillary 29 (*)    All other components within normal limits  CBG MONITORING, ED - Abnormal; Notable for the following components:   Glucose-Capillary 48 (*)    All other components within normal limits  CBG MONITORING, ED - Abnormal; Notable for the following components:   Glucose-Capillary 59 (*)    All other components within normal limits  CBG MONITORING, ED - Abnormal; Notable for the following components:   Glucose-Capillary 184 (*)    All other components within normal limits  CBG MONITORING, ED - Abnormal; Notable for the following components:    Glucose-Capillary 46 (*)    All other components within normal limits  CBG MONITORING, ED - Abnormal; Notable for the following components:   Glucose-Capillary 29 (*)    All other components within normal limits  CBG MONITORING, ED - Abnormal; Notable for the following components:   Glucose-Capillary 40 (*)    All other components within normal limits  CBG MONITORING, ED - Abnormal; Notable for the following components:   Glucose-Capillary 32 (*)    All other components within normal limits  SARS CORONAVIRUS 2 (HOSPITAL ORDER, PERFORMED IN [facility])  FERRITIN  BETA-HYDROXYBUTYRIC ACID  GLUCOSE, CAPILLARY  HIV ANTIBODY (ROUTINE TESTING W REFLEX)  C-PEPTIDE  PROINSULIN/INSULIN RATIO  SULFONYLUREA HYPOGLYCEMICS PANEL, SERUM  CBG MONITORING, ED  CBG MONITORING, ED  CBG MONITORING, ED  CBG MONITORING, ED    EKG EKG Interpretation  Date/Time:  Monday July 22 2019 15:14:18 EDT Ventricular Rate:  65 PR Interval:  154 QRS Duration: 94 QT Interval:  366 QTC Calculation: 380 R Axis:   80 Text Interpretation:  Normal sinus rhythm Normal ECG Rate is slower Confirmed by Molpus, John ([id number]) on 07/23/2019 12:11:35 PM   Radiology Ct Head Wo Contrast  Result Date: 07/22/2019 CLINICAL DATA:  Seizure, posterior head hematoma EXAM: CT HEAD WITHOUT CONTRAST CT CERVICAL SPINE WITHOUT CONTRAST TECHNIQUE: Multidetector CT imaging of the head and cervical spine was performed following the standard protocol without intravenous contrast. Multiplanar CT image reconstructions of the cervical spine were also generated. COMPARISON:  CT brain 11/28/2013, MRI brain 10/04/2016 FINDINGS: CT HEAD FINDINGS Brain: No evidence of acute infarction, hemorrhage, hydrocephalus, extra-axial collection or mass lesion/mass effect. Vascular: No hyperdense vessel or unexpected calcification. Skull: Normal. Negative for fracture or focal lesion. Sinuses/Orbits: No acute finding. Other: None CT CERVICAL SPINE  FINDINGS Alignment: Normal. Skull base and vertebrae: No acute fracture. No primary bone lesion or focal pathologic process. Soft tissues and spinal canal: No prevertebral fluid or swelling. No visible canal hematoma. Disc levels:  Within normal limits Upper chest: Negative Other: Negative IMPRESSION: 1. Negative non contrasted CT appearance of the brain. 2. No acute osseous abnormality of the cervical spine Electronically Signed   By: Jasmine PangKim  Fujinaga M.D.   On: 07/22/2019 21:02   Ct Cervical Spine Wo Contrast  Result Date: 07/22/2019 CLINICAL DATA:  Seizure, posterior head hematoma EXAM: CT HEAD WITHOUT CONTRAST CT CERVICAL SPINE WITHOUT CONTRAST TECHNIQUE: Multidetector CT imaging of the head and cervical spine was performed following the standard protocol without intravenous contrast. Multiplanar CT image reconstructions of the cervical spine were also generated. COMPARISON:  CT brain 11/28/2013, MRI brain 10/04/2016 FINDINGS: CT HEAD FINDINGS Brain: No evidence of acute infarction, hemorrhage, hydrocephalus, extra-axial collection or mass lesion/mass effect. Vascular: No hyperdense vessel or unexpected calcification. Skull: Normal. Negative for fracture or focal lesion. Sinuses/Orbits: No acute finding. Other: None CT CERVICAL SPINE FINDINGS Alignment: Normal. Skull base and vertebrae: No acute fracture. No primary bone lesion or focal pathologic process. Soft tissues and spinal canal: No prevertebral fluid or swelling. No visible canal hematoma. Disc levels:  Within normal limits Upper chest: Negative Other: Negative IMPRESSION: 1. Negative non contrasted CT appearance of the brain. 2. No acute osseous abnormality of the cervical spine Electronically Signed   By: Jasmine PangKim  Fujinaga M.D.   On: 07/22/2019 21:02    Procedures .Critical Care Performed by: Maxwell CaulLayden, Harlem Thresher A, PA-C Authorized by: Maxwell CaulLayden, Abass Misener A, PA-C   Critical care provider statement:    Critical care time (minutes):  35   Critical care was  necessary to treat or prevent imminent or life-threatening deterioration of the following conditions:  Metabolic crisis   Critical care was time spent personally by me on the following activities:  Discussions with consultants, evaluation of patient's response to treatment, examination of patient, ordering and performing treatments and interventions, ordering and review of laboratory studies, ordering and review of radiographic studies, pulse oximetry, re-evaluation of patient's condition, obtaining history from patient or surrogate and review of old charts   (including critical care time)  Medications Ordered in ED Medications  sodium chloride flush (NS) 0.9 %  injection 3 mL (3 mLs Intravenous Given 07/23/19 1221)  0.9 %  sodium chloride infusion (has no administration in time range)  divalproex (DEPAKOTE ER) 24 hr tablet 500 mg (500 mg Oral Given 07/23/19 0612)  enoxaparin (LOVENOX) injection 40 mg (40 mg Subcutaneous Given 07/23/19 0055)  LORazepam (ATIVAN) injection 1-2 mg (has no administration in time range)  dextrose (GLUTOSE) 40 % oral gel 37.5 g (37.5 g Oral Given 07/22/19 1754)  dextrose 50 % solution 50 mL (50 mLs Intravenous Given 07/22/19 1819)  valproate (DEPACON) 1,000 mg in dextrose 5 % 50 mL IVPB (0 mg Intravenous Stopped 07/22/19 2105)  sodium chloride 0.9 % bolus 1,000 mL (0 mLs Intravenous Stopped 07/22/19 2123)  dextrose 50 % solution (50 mLs  Given 07/22/19 2145)     Initial Impression / Assessment and Plan / ED Course  I have reviewed the triage vital signs and the nursing notes.  Pertinent labs & imaging results that were available during my care of the patient were reviewed by me and considered in my medical decision making (see chart for details).        28 year old male past medical history of seizures who presents for evaluation of seizure activity that occurred at work.  He reports he is on valproic acid.  He does report that he missed his dose today.  He does have  history of seizures and is followed by neurology but does not recall the name.  No recent sicknesses, fevers. Patient is afebrile, non-toxic appearing, sitting comfortably on examination table. Vital signs reviewed and stable.  Normal neuro deficits.  He does have some tenderness palpation with hematoma noted posterior aspect of head.  Will plan for imaging, labs.  Valproic acid level is 21.  CBC without any significant leukocytosis.  Hemoglobin stable at 11.8.  BMP is unremarkable.  His initial blood glucose was 67.  He had a repeat before he had any oral glucose which was in the 40s.  He had an oral tube of glucose which bumped him up to 50.  He was given 1 amp of D50 because he refused to eat.  His repeat CBG showed blood glucose of 184.  Discussed with Dr. Elon SpannerAroora (Neuro) who recommends giving 1000 mg of IV Depakote given his subtherapeutic valproic acid level.  He will need to resume his normal valproic acid tomorrow.  Recommends outpatient follow-up with neurology in the next week.  CT head negative for any acute abnormalities.  CT C-spine negative for acute abnormalities.  Repeat blood sugar showed that had dropped 43.  Patient was refusing to eat anything that we gave him.  He has now ordered himself a pizza and is eating pizza.  We will plan to recheck blood sugar.  I discussed with patient.  He reports he has his medications does not need refills.  He states that he does not know who he is seen for neurology.  We will give him ambulatory referral to neurology.  [facility], PA-C with blood sugar pending.   Portions of this note were generated with Scientist, clinical (histocompatibility and immunogenetics)Dragon dictation software. Dictation errors may occur despite best attempts at proofreading.   Final Clinical Impressions(s) / ED Diagnoses   Final diagnoses:  Seizure (HCC)  Hypoglycemia    ED Discharge Orders         Ordered    Ambulatory referral to Neurology    Comments: An appointment is requested in  approximately: 1 week   07/22/19 2122  Volanda Napoleon, PA-C 07/23/19 1233    Volanda Napoleon, PA-C 07/29/19 1107    Quintella Reichert, MD 07/29/19 1444

## 2019-07-22 NOTE — ED Notes (Signed)
Gave patient 2 juices and apple sauce at this time. Educated patient on eating and maintaining safe glucose as well.

## 2019-07-22 NOTE — ED Notes (Signed)
Patient blood glucose 48 prior to oral glucose, glucose 59 after. PA. Mendel Ryder informed of patient current glucose reading and that patient received IV dextrose 50 % solution 50 mL. Will continue to monitor

## 2019-07-22 NOTE — ED Triage Notes (Signed)
Pt from work. Pt's coworker found him on the ground shaking and foaming at the mouth. Pt has hematoma to posterior head, not bleeding. Pt was postictal until about 1430. Pt A&O x 4. Pt has hx of seizures, takes depakote.

## 2019-07-22 NOTE — ED Notes (Signed)
ED TO INPATIENT HANDOFF REPORT  ED Nurse Name and Phone #: [phone]  S Name/Age/Gender Joseph [name] 28 y.o. male Room/Bed: 042C/042C  Code Status   Code Status: Not on file  Home/SNF/Other Home Patient oriented to: self, place, time and situation Is this baseline? Yes   Triage Complete: Triage complete  Chief Complaint Seizure  Triage Note Pt from work. Pt's coworker found him on the ground shaking and foaming at the mouth. Pt has hematoma to posterior head, not bleeding. Pt was postictal until about 1430. Pt A&O x 4. Pt has hx of seizures, takes depakote.    Allergies Allergies  Allergen Reactions  . Mushroom Extract Complex Anaphylaxis  . Dust Mite Extract   . Shellfish Allergy Other (See Comments)    Itchy throat  . Tomato Rash    Level of Care/Admitting Diagnosis ED Disposition    ED Disposition Condition Comment   Admit  Hospital Area: [place] [[id number]]  Level of Care: Telemetry Medical [104]  I expect the patient will be discharged within 24 hours: Yes  LOW acuity---Tx typically complete <24 hrs---ACUTE conditions typically can be evaluated <24 hours---LABS likely to return to acceptable levels <24 hours---IS near functional baseline---EXPECTED to return to current living arrangement---NOT newly hypoxic: Meets criteria for 5C-Observation unit  Covid Evaluation: Asymptomatic Screening Protocol (No Symptoms)  Diagnosis: Breakthrough seizure [facility]) [[id number]]  Admitting Physician: Shela Leff [[id number]]  Attending Physician: Shela Leff [[id number]]  PT Class (Do Not Modify): Observation [104]  PT Acc Code (Do Not Modify): Observation [10022]       B Medical/Surgery History Past Medical History:  Diagnosis Date  . Seizures ([place])    No past surgical history on file.   A IV Location/Drains/Wounds Patient Lines/Drains/Airways Status   Active Line/Drains/Airways    Name:   Placement date:   Placement time:   Site:   Days:    Peripheral IV 07/22/19 Left Antecubital   07/22/19    1948    Antecubital   less than 1          Intake/Output Last 24 hours  Intake/Output Summary (Last 24 hours) at 07/22/2019 2319 Last data filed at 07/22/2019 2123 Gross per 24 hour  Intake 1050 ml  Output -  Net 1050 ml    Labs/Imaging Results for orders placed or performed during the hospital encounter of 07/22/19 (from the past 48 hour(s))  Basic metabolic panel - if new onset seizures     Status: Abnormal   Collection Time: 07/22/19  3:20 PM  Result Value Ref Range   Sodium 138 135 - 145 mmol/L   Potassium 3.7 3.5 - 5.1 mmol/L   Chloride 101 98 - 111 mmol/L   CO2 27 22 - 32 mmol/L   Glucose, Bld 67 (L) 70 - 99 mg/dL   BUN 9 6 - 20 mg/dL   Creatinine, Ser 0.82 0.61 - 1.24 mg/dL   Calcium 9.6 8.9 - 10.3 mg/dL   GFR calc non Af Amer >60 >60 mL/min   GFR calc Af Amer >60 >60 mL/min   Anion gap 10 5 - 15    Comment: Performed at [facility], [address], [place], [place] [address]  CBC - if new onset seizures     Status: Abnormal   Collection Time: 07/22/19  3:20 PM  Result Value Ref Range   WBC 4.3 4.0 - 10.5 K/uL   RBC 4.76 4.22 - 5.81 MIL/uL   Hemoglobin 11.8 (L) 13.0 -  17.0 g/dL   HCT 16.138.8 (L) 09.639.0 - 04.552.0 %   MCV 81.5 80.0 - 100.0 fL   MCH 24.8 (L) 26.0 - 34.0 pg   MCHC 30.4 30.0 - 36.0 g/dL   RDW 40.911.9 81.111.5 - 91.415.5 %   Platelets 196 150 - 400 K/uL   nRBC 0.0 0.0 - 0.2 %    Comment: Performed at [facility], [address] [place], [place] [address]  Valproic acid level     Status: Abnormal   Collection Time: 07/22/19  5:24 PM  Result Value Ref Range   Valproic Acid Lvl 21 (L) 50.0 - 100.0 ug/mL    Comment: Performed at [facility], [address] [place], [place] [address]  CBG monitoring, ED     Status: Abnormal   Collection Time: 07/22/19  5:57 PM  Result Value Ref Range   Glucose-Capillary 48 (L) 70 - 99 mg/dL   Comment 1 Notify RN   POC CBG, ED     Status: Abnormal    Collection Time: 07/22/19  6:17 PM  Result Value Ref Range   Glucose-Capillary 59 (L) 70 - 99 mg/dL  CBG monitoring, ED     Status: Abnormal   Collection Time: 07/22/19  6:31 PM  Result Value Ref Range   Glucose-Capillary 184 (H) 70 - 99 mg/dL  POC CBG, ED     Status: Abnormal   Collection Time: 07/22/19  9:14 PM  Result Value Ref Range   Glucose-Capillary 46 (L) 70 - 99 mg/dL  POC CBG, ED     Status: Abnormal   Collection Time: 07/22/19  9:29 PM  Result Value Ref Range   Glucose-Capillary 29 (LL) 70 - 99 mg/dL   Comment 1 Notify RN   CBG monitoring, ED     Status: Abnormal   Collection Time: 07/22/19  9:32 PM  Result Value Ref Range   Glucose-Capillary 40 (LL) 70 - 99 mg/dL   Comment 1 Notify RN   CBG monitoring, ED     Status: Abnormal   Collection Time: 07/22/19  9:40 PM  Result Value Ref Range   Glucose-Capillary 32 (LL) 70 - 99 mg/dL   Comment 1 Document in Chart   CBG monitoring, ED     Status: None   Collection Time: 07/22/19 10:00 PM  Result Value Ref Range   Glucose-Capillary 97 70 - 99 mg/dL  POC CBG, ED     Status: None   Collection Time: 07/22/19 10:31 PM  Result Value Ref Range   Glucose-Capillary 81 70 - 99 mg/dL  CBG monitoring, ED     Status: None   Collection Time: 07/22/19 10:55 PM  Result Value Ref Range   Glucose-Capillary 84 70 - 99 mg/dL   Ct Head Wo Contrast  Result Date: 07/22/2019 CLINICAL DATA:  Seizure, posterior head hematoma EXAM: CT HEAD WITHOUT CONTRAST CT CERVICAL SPINE WITHOUT CONTRAST TECHNIQUE: Multidetector CT imaging of the head and cervical spine was performed following the standard protocol without intravenous contrast. Multiplanar CT image reconstructions of the cervical spine were also generated. COMPARISON:  CT brain 11/28/2013, MRI brain 10/04/2016 FINDINGS: CT HEAD FINDINGS Brain: No evidence of acute infarction, hemorrhage, hydrocephalus, extra-axial collection or mass lesion/mass effect. Vascular: No hyperdense vessel or  unexpected calcification. Skull: Normal. Negative for fracture or focal lesion. Sinuses/Orbits: No acute finding. Other: None CT CERVICAL SPINE FINDINGS Alignment: Normal. Skull base and vertebrae: No acute fracture. No primary bone lesion or focal pathologic process. Soft tissues  and spinal canal: No prevertebral fluid or swelling. No visible canal hematoma. Disc levels:  Within normal limits Upper chest: Negative Other: Negative IMPRESSION: 1. Negative non contrasted CT appearance of the brain. 2. No acute osseous abnormality of the cervical spine Electronically Signed   By: Jasmine PangKim  Fujinaga M.D.   On: 07/22/2019 21:02   Ct Cervical Spine Wo Contrast  Result Date: 07/22/2019 CLINICAL DATA:  Seizure, posterior head hematoma EXAM: CT HEAD WITHOUT CONTRAST CT CERVICAL SPINE WITHOUT CONTRAST TECHNIQUE: Multidetector CT imaging of the head and cervical spine was performed following the standard protocol without intravenous contrast. Multiplanar CT image reconstructions of the cervical spine were also generated. COMPARISON:  CT brain 11/28/2013, MRI brain 10/04/2016 FINDINGS: CT HEAD FINDINGS Brain: No evidence of acute infarction, hemorrhage, hydrocephalus, extra-axial collection or mass lesion/mass effect. Vascular: No hyperdense vessel or unexpected calcification. Skull: Normal. Negative for fracture or focal lesion. Sinuses/Orbits: No acute finding. Other: None CT CERVICAL SPINE FINDINGS Alignment: Normal. Skull base and vertebrae: No acute fracture. No primary bone lesion or focal pathologic process. Soft tissues and spinal canal: No prevertebral fluid or swelling. No visible canal hematoma. Disc levels:  Within normal limits Upper chest: Negative Other: Negative IMPRESSION: 1. Negative non contrasted CT appearance of the brain. 2. No acute osseous abnormality of the cervical spine Electronically Signed   By: Jasmine PangKim  Fujinaga M.D.   On: 07/22/2019 21:02    Pending Labs Unresulted Labs (From admission, onward)     Start     Ordered   07/22/19 2239  SARS Coronavirus 2 (CEPHEID - Performed in [facility] hospital lab), Hosp Order  (Asymptomatic Patients Labs)  Once,   STAT    Question:  Rule Out  Answer:  Yes   07/22/19 2238          Vitals/Pain Today's Vitals   07/22/19 2030 07/22/19 2207 07/22/19 2226 07/22/19 2238  BP:  137/76  115/61  Pulse:  74  82  Resp: 12 14  13   Temp:      TempSrc:      SpO2:  96%  97%  PainSc:   3      Isolation Precautions No active isolations  Medications Medications  sodium chloride flush (NS) 0.9 % injection 3 mL (3 mLs Intravenous Given 07/22/19 2157)  sodium chloride flush (NS) 0.9 % injection 3 mL (has no administration in time range)  0.9 %  sodium chloride infusion (has no administration in time range)  dextrose (GLUTOSE) 40 % oral gel 37.5 g (37.5 g Oral Given 07/22/19 1754)  dextrose 50 % solution 50 mL (50 mLs Intravenous Given 07/22/19 1819)  valproate (DEPACON) 1,000 mg in dextrose 5 % 50 mL IVPB (0 mg Intravenous Stopped 07/22/19 2105)  sodium chloride 0.9 % bolus 1,000 mL (0 mLs Intravenous Stopped 07/22/19 2123)  dextrose 50 % solution (50 mLs  Given 07/22/19 2145)    Mobility walks Low fall risk   Focused Assessments hypoglycemia, seizures, a/o   R Recommendations: See Admitting Provider Note  Report given to:   Additional Notes:

## 2019-07-22 NOTE — H&P (Addendum)
History and Physical    Joseph [name] VHQ:[id number] DOB: 06/23/1991 DOA: 07/22/2019  PCP: [facility] Patient coming from: Workplace  Chief Complaint: Seizure activity  HPI: Joseph [name] is a 28 y.o. male with medical history significant of seizures on Depakote presenting to the hospital from his workplace for evaluation of seizure activity.  Per EMS report, patient works at a [employer] and a Radio broadcast assistantcoworker found him on the ground shaking and foaming at the mouth.  On arrival, EMS noted that he was postictal until about 1430.  Upon arrival to the ED, noted to have a hematoma to his posterior head, no bleeding.  AAO x4 upon arrival to the ED. patient states he was in his usual state of health and went to work where he had a seizure.  States he has a history of seizures for which he takes Depakote.  States he has not been taking Depakote regularly so that his medication bottle would last longer as he does not have a primary care physician or neurologist to request refills.  He has missed doses of Depakote this past week and yesterday morning as well.  Denies history of diabetes or insulin/oral hypoglycemic drug use.  Denies any fevers, nausea, vomiting, diarrhea, or abdominal pain.  States his appetite is good.  No other complaints.  ED Course: Afebrile and hemodynamically stable on arrival.  No leukocytosis.  Hemoglobin 11.8 and MCV 81.5., no recent baseline.  Valproic acid level subtherapeutic at 21.  Initial blood glucose 67, repeat before he had any oral glucose was in the 40s.  Patient continued to be hypoglycemic in the ED despite receiving oral glucose and food.  CBG as low as 29.  COVID-19 test pending.  CT head and C-spine negative for acute finding. ED provider discussed with Dr. Jerrell BelfastAurora from neurology who recommended giving IV valproate 1000 mg loading dose and resume his home dose of valproic acid tomorrow.  He recommended outpatient neurology follow-up in the next week.  Patient received dextrose gel, D50, IV valproate 1000 mg, and 1 L fluid bolus in the ED.  Review of Systems:  All systems reviewed and apart from history of presenting illness, are negative.  Past Medical History:  Diagnosis Date  . Seizures (H[facility])     Past Surgical History:  Procedure Laterality Date  . FINGER SURGERY       reports that he has never smoked. He has never used smokeless tobacco. He reports that he does not drink alcohol or use drugs.  Allergies  Allergen Reactions  . Mushroom Extract Complex Anaphylaxis  . Dust Mite Extract   . Shellfish Allergy Other (See Comments)    Itchy throat  . Tomato Rash    History reviewed. No pertinent family history.  Prior to Admission medications   Medication Sig Start Date End Date Taking? Authorizing Provider  divalproex (DEPAKOTE ER) 500 MG 24 hr tablet Take 500 mg by mouth 2 (two) times a day.   Yes [provider]    Physical Exam: Vitals:   07/22/19 2030 07/22/19 2207 07/22/19 2238 07/23/19 0027  BP:  137/76 115/61 131/67  Pulse:  74 82 75  Resp: 12 14 13 19   Temp:    98.2 F (36.8 C)  TempSrc:    Oral  SpO2:  96% 97% 100%    Physical Exam  Constitutional: He is oriented to person, place, and time. He appears well-developed and well-nourished. No distress.  HENT:  Head: Normocephalic.  Mouth/Throat: Oropharynx  is clear and moist.  Eyes: Pupils are equal, round, and reactive to light. EOM are normal. Right eye exhibits no discharge. Left eye exhibits no discharge.  Neck: Neck supple.  Cardiovascular: Normal rate, regular rhythm and intact distal pulses.  Pulmonary/Chest: Effort normal and breath sounds normal. No respiratory distress. He has no wheezes. He has no rales.  Abdominal: Soft. Bowel sounds are normal. He exhibits no distension. There is no abdominal tenderness. There is no guarding.  Musculoskeletal:        General: No edema.  Neurological: He is alert and oriented to person, place, and  time. No cranial nerve deficit.  Strength 5 out of 5 in bilateral upper and lower extremities.  Sensation to light touch intact throughout.  Skin: Skin is warm and dry. He is not diaphoretic.     Labs on Admission: I have personally reviewed following labs and imaging studies  CBC: Recent Labs  Lab 07/22/19 1520  WBC 4.3  HGB 11.8*  HCT 38.8*  MCV 81.5  PLT 196   Basic Metabolic Panel: Recent Labs  Lab 07/22/19 1520  NA 138  K 3.7  CL 101  CO2 27  GLUCOSE 67*  BUN 9  CREATININE 0.82  CALCIUM 9.6   GFR: CrCl cannot be calculated (Unknown ideal weight.). Liver Function Tests: No results for input(s): AST, ALT, ALKPHOS, BILITOT, PROT, ALBUMIN in the last 168 hours. No results for input(s): LIPASE, AMYLASE in the last 168 hours. No results for input(s): AMMONIA in the last 168 hours. Coagulation Profile: No results for input(s): INR, PROTIME in the last 168 hours. Cardiac Enzymes: No results for input(s): CKTOTAL, CKMB, CKMBINDEX, TROPONINI in the last 168 hours. BNP (last 3 results) No results for input(s): PROBNP in the last 8760 hours. HbA1C: No results for input(s): HGBA1C in the last 72 hours. CBG: Recent Labs  Lab 07/22/19 2200 07/22/19 2231 07/22/19 2255 07/22/19 2358 07/23/19 0111  GLUCAP 97 81 84 97 76   Lipid Profile: No results for input(s): CHOL, HDL, LDLCALC, TRIG, CHOLHDL, LDLDIRECT in the last 72 hours. Thyroid Function Tests: No results for input(s): TSH, T4TOTAL, FREET4, T3FREE, THYROIDAB in the last 72 hours. Anemia Panel: No results for input(s): VITAMINB12, FOLATE, FERRITIN, TIBC, IRON, RETICCTPCT in the last 72 hours. Urine analysis:    Component Value Date/Time   COLORURINE YELLOW 06/20/2010 1825   APPEARANCEUR CLEAR 06/20/2010 1825   LABSPEC 1.015 06/20/2010 1825   PHURINE 7.0 06/20/2010 1825   GLUCOSEU NEGATIVE 06/20/2010 1825   HGBUR NEGATIVE 06/20/2010 1825   BILIRUBINUR NEGATIVE 06/20/2010 1825   KETONESUR NEGATIVE  06/20/2010 1825   PROTEINUR NEGATIVE 06/20/2010 1825   UROBILINOGEN 1.0 06/20/2010 1825   NITRITE NEGATIVE 06/20/2010 1825   LEUKOCYTESUR  06/20/2010 1825    NEGATIVE MICROSCOPIC NOT DONE ON URINES WITH NEGATIVE PROTEIN, BLOOD, LEUKOCYTES, NITRITE, OR GLUCOSE <1000 mg/dL.    Radiological Exams on Admission: Ct Head Wo Contrast  Result Date: 07/22/2019 CLINICAL DATA:  Seizure, posterior head hematoma EXAM: CT HEAD WITHOUT CONTRAST CT CERVICAL SPINE WITHOUT CONTRAST TECHNIQUE: Multidetector CT imaging of the head and cervical spine was performed following the standard protocol without intravenous contrast. Multiplanar CT image reconstructions of the cervical spine were also generated. COMPARISON:  CT brain 11/28/2013, MRI brain 10/04/2016 FINDINGS: CT HEAD FINDINGS Brain: No evidence of acute infarction, hemorrhage, hydrocephalus, extra-axial collection or mass lesion/mass effect. Vascular: No hyperdense vessel or unexpected calcification. Skull: Normal. Negative for fracture or focal lesion. Sinuses/Orbits: No acute finding. Other: None CT  CERVICAL SPINE FINDINGS Alignment: Normal. Skull base and vertebrae: No acute fracture. No primary bone lesion or focal pathologic process. Soft tissues and spinal canal: No prevertebral fluid or swelling. No visible canal hematoma. Disc levels:  Within normal limits Upper chest: Negative Other: Negative IMPRESSION: 1. Negative non contrasted CT appearance of the brain. 2. No acute osseous abnormality of the cervical spine Electronically Signed   By: Donavan Foil M.D.   On: 07/22/2019 21:02   Ct Cervical Spine Wo Contrast  Result Date: 07/22/2019 CLINICAL DATA:  Seizure, posterior head hematoma EXAM: CT HEAD WITHOUT CONTRAST CT CERVICAL SPINE WITHOUT CONTRAST TECHNIQUE: Multidetector CT imaging of the head and cervical spine was performed following the standard protocol without intravenous contrast. Multiplanar CT image reconstructions of the cervical spine were  also generated. COMPARISON:  CT brain 11/28/2013, MRI brain 10/04/2016 FINDINGS: CT HEAD FINDINGS Brain: No evidence of acute infarction, hemorrhage, hydrocephalus, extra-axial collection or mass lesion/mass effect. Vascular: No hyperdense vessel or unexpected calcification. Skull: Normal. Negative for fracture or focal lesion. Sinuses/Orbits: No acute finding. Other: None CT CERVICAL SPINE FINDINGS Alignment: Normal. Skull base and vertebrae: No acute fracture. No primary bone lesion or focal pathologic process. Soft tissues and spinal canal: No prevertebral fluid or swelling. No visible canal hematoma. Disc levels:  Within normal limits Upper chest: Negative Other: Negative IMPRESSION: 1. Negative non contrasted CT appearance of the brain. 2. No acute osseous abnormality of the cervical spine Electronically Signed   By: Donavan Foil M.D.   On: 07/22/2019 21:02    EKG: Independently reviewed.  Sinus rhythm, heart rate 65.  Assessment/Plan Principal Problem:   Breakthrough seizure ([place]) Active Problems:   Hypoglycemia without diagnosis of diabetes mellitus   Normocytic anemia   Encounter for screening for HIV   Breakthrough seizure Precipitating factors include Depakote noncompliance and hypoglycemia. Valproic acid level subtherapeutic at 21.  Head CT negative for acute finding.  ED provider discussed with Dr. Malen Gauze from neurology who recommended giving IV valproate 1000 mg loading dose and resume his home dose of valproic acid tomorrow.  He recommended outpatient neurology follow-up in the next week.  No further seizure activity since patient has been in the hospital. -Received IV valproate 1000 mg in the ED.  Continue home Depakote 500 mg twice daily. -Ativan PRN seizure -Seizure precautions  Hypoglycemia in a nondiabetic Unclear etiology.   Initial blood glucose 67, repeat before he had any oral glucose was in the 40s.  Patient continued to be hypoglycemic in the ED despite receiving oral  glucose and food.  CBG as low as 29. -D5 infusion @100  cc/hr -CBG was initially checked every 1 hour and remained stable in the 80s to 90s.  Check CBG every 2 hours. -Hypoglycemia work-up including insulin, C-peptide, beta hydroxybutyrate, proinsulin, sulfonylurea hypoglycemics panel  Normocytic anemia Hemoglobin 11.8 and MCV 81.5., no recent baseline.  No signs of active bleeding. -Check iron, ferritin, TIBC  HIV screening The patient falls between the ages of 13-64 and should be screened for HIV, therefore HIV testing ordered.  DVT prophylaxis: Lovenox Code Status: Full code Family Communication: No family available at this time. Disposition Plan: Anticipate discharge after clinical improvement. Consults called: None Admission status: It is my clinical opinion that referral for OBSERVATION is reasonable and necessary in this patient based on the above information provided. The aforementioned taken together are felt to place the patient at high risk for further clinical deterioration. However it is anticipated that the patient may be medically  stable for discharge from the hospital within 24 to 48 hours.  The medical decision making on this patient was of high complexity and the patient is at high risk for clinical deterioration, therefore this is a level 3 visit.  John GiovanniVasundhra Marianita Botkin MD [facility] Pager [phone]- [phone]  If 7PM-7AM, please contact night-coverage [email or web address] Password [email or web address]  07/23/2019, 2:09 AM

## 2019-07-22 NOTE — ED Notes (Signed)
PA, Mendel Ryder notified of patient blood glucose being 49. Patient eating a pizza that he ordered and gatorade that he ordered. Patient provided with juices as well for intake.

## 2019-07-22 NOTE — Discharge Instructions (Signed)
Take your seizure medication as directed.  Follow-up with referred neurologist.  You should not drive, shower, bathe alone, swim alone until you are seen by the neurologist.  Return the emergency department for any seizure activity, fevers, numbness/weakness or any other worsening or concerning symptoms.

## 2019-07-22 NOTE — ED Notes (Signed)
PA Mendel Ryder notified that amp of D50 given for CBG of 59, pt sleepy but can be aroused, will continue to monitor.

## 2019-07-22 NOTE — ED Notes (Signed)
  Patients blood glucose checked 2x via fingersticks with low glucose results (see results). Patient consumed 3 slices of pizza and 2 apple juices during this time after being educated about low gluocose. IV sample retrieved for third glucose check, resulted to 32. Patient given dextrose 50% iv injection and PA, khatri notified.   PA, khatri at bedside at this time. Glucose checked at this time (result: 97)

## 2019-07-22 NOTE — ED Notes (Signed)
Patient provided with [place] sandwich and juice for po intake. Patient also educated about keeping on pulse ox finger probe due to taking it off intermittently.

## 2019-07-23 ENCOUNTER — Encounter (HOSPITAL_COMMUNITY): Payer: Self-pay | Admitting: Internal Medicine

## 2019-07-23 DIAGNOSIS — D649 Anemia, unspecified: Secondary | ICD-10-CM | POA: Diagnosis present

## 2019-07-23 DIAGNOSIS — E162 Hypoglycemia, unspecified: Secondary | ICD-10-CM | POA: Diagnosis present

## 2019-07-23 DIAGNOSIS — Z114 Encounter for screening for human immunodeficiency virus [HIV]: Secondary | ICD-10-CM

## 2019-07-23 LAB — BETA-HYDROXYBUTYRIC ACID: Beta-Hydroxybutyric Acid: 0.09 mmol/L (ref 0.05–0.27)

## 2019-07-23 LAB — SARS CORONAVIRUS 2 BY RT PCR (HOSPITAL ORDER, PERFORMED IN ~~LOC~~ HOSPITAL LAB): SARS Coronavirus 2: NEGATIVE

## 2019-07-23 LAB — GLUCOSE, CAPILLARY
Glucose-Capillary: 29 mg/dL — CL (ref 70–99)
Glucose-Capillary: 76 mg/dL (ref 70–99)
Glucose-Capillary: 106 mg/dL — ABNORMAL HIGH (ref 70–99)
Glucose-Capillary: 100 mg/dL — ABNORMAL HIGH (ref 70–99)
Glucose-Capillary: 120 mg/dL — ABNORMAL HIGH (ref 70–99)
Glucose-Capillary: 104 mg/dL — ABNORMAL HIGH (ref 70–99)

## 2019-07-23 LAB — CBG MONITORING, ED: Glucose-Capillary: 97 mg/dL (ref 70–99)

## 2019-07-23 LAB — IRON AND TIBC
Iron: 18 ug/dL — ABNORMAL LOW (ref 45–182)
Saturation Ratios: 7 % — ABNORMAL LOW (ref 17.9–39.5)
TIBC: 248 ug/dL — ABNORMAL LOW (ref 250–450)
UIBC: 230 ug/dL

## 2019-07-23 LAB — FERRITIN: Ferritin: 146 ng/mL (ref 24–336)

## 2019-07-23 LAB — HIV ANTIBODY (ROUTINE TESTING W REFLEX): HIV Screen 4th Generation wRfx: NONREACTIVE

## 2019-07-23 NOTE — TOC Transition Note (Signed)
Transition of Care [facility]) - CM/SW Discharge Note   Patient Details  Name: Joseph [name] MRN: [id number] Date of Birth: 02-28-1991  [facility] CM/SW Contact:  Ella Bodo, RN Phone Number: 07/23/2019, 3:47 PM   Clinical Narrative:   Pt admitted on 07/22/19 with seizure activity.  PTA, pt independent, lives at home with family members.  Pt states he has no insurance or PCP, but is agreeable to follow up at [facility].  Follow up appointment made and appt information placed on AVS.     Final next level of care: Home/Self Care Barriers to Discharge: Barriers Resolved                       Discharge Plan and Services   Discharge Planning Services: CM Consult, [facility], Follow-up appt scheduled                                 Social Determinants of Health (SDOH) Interventions     Readmission Risk Interventions No flowsheet data found.  Reinaldo Raddle, RN, BSN  Trauma/Neuro ICU Case Manager [phone]

## 2019-07-23 NOTE — Discharge Summary (Signed)
Physician Discharge Summary  Joseph [name] UXL:[id number] DOB: 1991/01/17 DOA: 07/22/2019  PCP: [facility] date: 07/22/2019 Discharge date: 07/23/2019  Time spent: 45 minutes  Recommendations for Outpatient Follow-up:  1. Follow up with neuro. Office will call you for appointment 2. Follow up PCP for tracking anemia   Discharge Diagnoses:  Principal Problem:   Breakthrough seizure ([place]) Active Problems:   Hypoglycemia without diagnosis of diabetes mellitus   Normocytic anemia   Encounter for screening for HIV   Discharge Condition: stable  Diet recommendation: regular  There were no vitals filed for this visit.  History of present illness:  Joseph [name] is a 28 y.o. male with medical history significant of seizures on Depakote presented to the hospital 7/27 from his workplace for evaluation of seizure activity.  Per EMS report, patient works at a [employer] and a Mudlogger found him on the ground shaking and foaming at the mouth.  On arrival, EMS noted that he was postictal until about 1430.  Upon arrival to the ED, noted to have a hematoma to his posterior head, no bleeding.  AAO x4 upon arrival to the ED. patient stated he was in his usual state of health and went to work where he had a seizure.  Stated he has a history of seizures for which he takes Depakote.  Stated he has not been taking Depakote regularly so that his medication bottle would last longer as he does not have a primary care physician or neurologist to request refills.  He has missed doses of Depakote this past week and yesterday morning as well.  Denied history of diabetes or insulin/oral hypoglycemic drug use.  Denied any fevers, nausea, vomiting, diarrhea, or abdominal pain.  Statef his appetite is good.  No other complaints  Hospital Course:  Breakthrough seizure Precipitating factors include Depakote noncompliance and hypoglycemia. Valproic acid level subtherapeutic at 21.  Head CT  negative for acute finding.  ED provider discussed with Dr. Malen Gauze from neurology who recommended giving IV valproate 1000 mg loading dose and resume his home dose of valproic acid tomorrow.  He recommended outpatient neurology follow-up in the next week.  No further seizure activity in the hospital. He received  IV valproate 1000 mg in the ED.  Continued home Depakote 500 mg twice daily.  Hypoglycemia in a nondiabetic Unclear etiology.   Initial blood glucose 67, repeat before he had any oral glucose was in the 40s.  Patient continued to be hypoglycemic in the ED despite receiving oral glucose and food.  CBG as low as 29. He received D5 infusion @100  cc/hr.CBG was initially checked every 1 hour and remained stable in the 80s to 90s. At discharge IV fluids off and patient eating well. CBG 100. C-peptide in process, beta hydroxybutyrate 0.09 proinsulin in process, sulfonylurea hypoglycemics panel in process.   Normocytic anemia Hemoglobin 11.8 and MCV 81.5 ferritin 146, Iron 18, ratio 7, no recent baseline.  No signs of active bleeding. OP follow up  HIV screening non-reactive.   Procedures:    Consultations:    Discharge Exam: Vitals:   07/23/19 0400 07/23/19 1145  BP: (!) 120/56 (!) 108/48  Pulse: 64 (!) 58  Resp: 18 18  Temp: 98 F (36.7 C) (!) 97.4 F (36.3 C)  SpO2: 100% 98%    General: awake alert oriented x3. Speech clear Cardiovascular: rrr no mgr no LE edema Respiratory: normal effort BS clear bilaterally no wheeze  Discharge Instructions   Discharge  Instructions    Ambulatory referral to Neurology   Complete by: As directed    An appointment is requested in approximately: 1 week   Diet - low sodium heart healthy   Complete by: As directed    Discharge instructions   Complete by: As directed    Follow up with neuro 1 week for evaluation of symptoms   Increase activity slowly   Complete by: As directed      Allergies as of 07/23/2019      Reactions    Mushroom Extract Complex Anaphylaxis   Dust Mite Extract    Shellfish Allergy Other (See Comments)   Itchy throat   Tomato Rash      Medication List    TAKE these medications   Depakote ER 500 MG 24 hr tablet Generic drug: divalproex Take 500 mg by mouth 2 (two) times a day.      Allergies  Allergen Reactions  . Mushroom Extract Complex Anaphylaxis  . Dust Mite Extract   . Shellfish Allergy Other (See Comments)    Itchy throat  . Tomato Rash   Follow-up Information    [facility]. Call in 2 days.   Specialty: Neurology Contact information: [address] [place] [id number] [phone]           The results of significant diagnostics from this hospitalization (including imaging, microbiology, ancillary and laboratory) are listed below for reference.    Significant Diagnostic Studies: Ct Head Wo Contrast  Result Date: 07/22/2019 CLINICAL DATA:  Seizure, posterior head hematoma EXAM: CT HEAD WITHOUT CONTRAST CT CERVICAL SPINE WITHOUT CONTRAST TECHNIQUE: Multidetector CT imaging of the head and cervical spine was performed following the standard protocol without intravenous contrast. Multiplanar CT image reconstructions of the cervical spine were also generated. COMPARISON:  CT brain 11/28/2013, MRI brain 10/04/2016 FINDINGS: CT HEAD FINDINGS Brain: No evidence of acute infarction, hemorrhage, hydrocephalus, extra-axial collection or mass lesion/mass effect. Vascular: No hyperdense vessel or unexpected calcification. Skull: Normal. Negative for fracture or focal lesion. Sinuses/Orbits: No acute finding. Other: None CT CERVICAL SPINE FINDINGS Alignment: Normal. Skull base and vertebrae: No acute fracture. No primary bone lesion or focal pathologic process. Soft tissues and spinal canal: No prevertebral fluid or swelling. No visible canal hematoma. Disc levels:  Within normal limits Upper chest: Negative Other: Negative IMPRESSION: 1.  Negative non contrasted CT appearance of the brain. 2. No acute osseous abnormality of the cervical spine Electronically Signed   By: Jasmine PangKim  Fujinaga M.D.   On: 07/22/2019 21:02   Ct Cervical Spine Wo Contrast  Result Date: 07/22/2019 CLINICAL DATA:  Seizure, posterior head hematoma EXAM: CT HEAD WITHOUT CONTRAST CT CERVICAL SPINE WITHOUT CONTRAST TECHNIQUE: Multidetector CT imaging of the head and cervical spine was performed following the standard protocol without intravenous contrast. Multiplanar CT image reconstructions of the cervical spine were also generated. COMPARISON:  CT brain 11/28/2013, MRI brain 10/04/2016 FINDINGS: CT HEAD FINDINGS Brain: No evidence of acute infarction, hemorrhage, hydrocephalus, extra-axial collection or mass lesion/mass effect. Vascular: No hyperdense vessel or unexpected calcification. Skull: Normal. Negative for fracture or focal lesion. Sinuses/Orbits: No acute finding. Other: None CT CERVICAL SPINE FINDINGS Alignment: Normal. Skull base and vertebrae: No acute fracture. No primary bone lesion or focal pathologic process. Soft tissues and spinal canal: No prevertebral fluid or swelling. No visible canal hematoma. Disc levels:  Within normal limits Upper chest: Negative Other: Negative IMPRESSION: 1. Negative non contrasted CT appearance of the brain. 2. No acute  osseous abnormality of the cervical spine Electronically Signed   By: Jasmine PangKim  Fujinaga M.D.   On: 07/22/2019 21:02    Microbiology: Recent Results (from the past 240 hour(s))  SARS Coronavirus 2 (CEPHEID - Performed in [facility]), Hosp Order     Status: None   Collection Time: 07/22/19 10:55 PM   Specimen: Nasopharyngeal Swab  Result Value Ref Range Status   SARS Coronavirus 2 NEGATIVE NEGATIVE Final    Comment: (NOTE) If result is NEGATIVE SARS-CoV-2 target nucleic acids are NOT DETECTED. The SARS-CoV-2 RNA is generally detectable in upper and lower  respiratory specimens during the acute  phase of infection. The lowest  concentration of SARS-CoV-2 viral copies this assay can detect is 250  copies / mL. A negative result does not preclude SARS-CoV-2 infection  and should not be used as the sole basis for treatment or other  patient management decisions.  A negative result may occur with  improper specimen collection / handling, submission of specimen other  than nasopharyngeal swab, presence of viral mutation(s) within the  areas targeted by this assay, and inadequate number of viral copies  (<250 copies / mL). A negative result must be combined with clinical  observations, patient history, and epidemiological information. If result is POSITIVE SARS-CoV-2 target nucleic acids are DETECTED. The SARS-CoV-2 RNA is generally detectable in upper and lower  respiratory specimens dur ing the acute phase of infection.  Positive  results are indicative of active infection with SARS-CoV-2.  Clinical  correlation with patient history and other diagnostic information is  necessary to determine patient infection status.  Positive results do  not rule out bacterial infection or co-infection with other viruses. If result is PRESUMPTIVE POSTIVE SARS-CoV-2 nucleic acids MAY BE PRESENT.   A presumptive positive result was obtained on the submitted specimen  and confirmed on repeat testing.  While 2019 novel coronavirus  (SARS-CoV-2) nucleic acids may be present in the submitted sample  additional confirmatory testing may be necessary for epidemiological  and / or clinical management purposes  to differentiate between  SARS-CoV-2 and other Sarbecovirus currently known to infect humans.  If clinically indicated additional testing with an alternate test  methodology [phone]([id number]) is advised. The SARS-CoV-2 RNA is generally  detectable in upper and lower respiratory sp ecimens during the acute  phase of infection. The expected result is Negative. Fact Sheet for Patients:   BoilerBrush.com.cyhttps://www.fda.gov/media/136312/download Fact Sheet for Healthcare Providers: https://pope.com/https://www.fda.gov/media/136313/download This test is not yet approved or cleared by the [place] FDA and has been authorized for detection and/or diagnosis of SARS-CoV-2 by FDA under an Emergency Use Authorization (EUA).  This EUA will remain in effect (meaning this test can be used) for the duration of the COVID-19 declaration under Section 564(b)(1) of the Act, 21 U.S.C. section 360bbb-3(b)(1), unless the authorization is terminated or revoked sooner. Performed at [facility], [address] [place], [place] [address]      Labs: Basic Metabolic Panel: Recent Labs  Lab 07/22/19 1520  NA 138  K 3.7  CL 101  CO2 27  GLUCOSE 67*  BUN 9  CREATININE 0.82  CALCIUM 9.6   Liver Function Tests: No results for input(s): AST, ALT, ALKPHOS, BILITOT, PROT, ALBUMIN in the last 168 hours. No results for input(s): LIPASE, AMYLASE in the last 168 hours. No results for input(s): AMMONIA in the last 168 hours. CBC: Recent Labs  Lab 07/22/19 1520  WBC 4.3  HGB 11.8*  HCT 38.8*  MCV  81.5  PLT 196   Cardiac Enzymes: No results for input(s): CKTOTAL, CKMB, CKMBINDEX, TROPONINI in the last 168 hours. BNP: BNP (last 3 results) No results for input(s): BNP in the last 8760 hours.  ProBNP (last 3 results) No results for input(s): PROBNP in the last 8760 hours.  CBG: Recent Labs  Lab 07/22/19 2255 07/22/19 2358 07/23/19 0111 07/23/19 0359 07/23/19 0610  GLUCAP 84 97 76 106* 100*       Signed:  Gwenyth BenderBLACK,Nechama Escutia M NP [facility] 07/23/2019, 2:15 PM

## 2019-07-24 LAB — C-PEPTIDE: C-Peptide: 1.5 ng/mL (ref 1.1–4.4)

## 2019-07-28 LAB — PROINSULIN/INSULIN RATIO
Insulin: 5.9 u[IU]/mL
Proinsulin/Insulin Ratio: 18 %
Proinsulin: 7.2 pmol/L

## 2019-08-04 LAB — SULFONYLUREA HYPOGLYCEMICS PANEL, SERUM
Acetohexamide: NEGATIVE ug/mL (ref 20–60)
Chlorpropamide: NEGATIVE ug/mL (ref 75–250)
Glimepiride: NEGATIVE ng/mL (ref 80–250)
Glipizide: NEGATIVE ng/mL (ref 200–1000)
Glyburide: NEGATIVE ng/mL
Nateglinide: NEGATIVE ng/mL
Repaglinide: NEGATIVE ng/mL
Tolazamide: NEGATIVE ug/mL
Tolbutamide: NEGATIVE ug/mL (ref 40–100)

## 2019-08-23 ENCOUNTER — Ambulatory Visit: Payer: Self-pay | Attending: Family Medicine | Admitting: Family Medicine

## 2019-08-23 ENCOUNTER — Encounter: Payer: Self-pay | Admitting: Family Medicine

## 2019-08-23 DIAGNOSIS — G40909 Epilepsy, unspecified, not intractable, without status epilepticus: Secondary | ICD-10-CM

## 2019-08-23 MED ORDER — DIVALPROEX SODIUM ER 500 MG PO TB24: 500.0000 mg | ORAL_TABLET | Freq: Two times a day (BID) | ORAL | 3 refills | Status: DC

## 2019-08-23 NOTE — Progress Notes (Signed)
Patient ID: Joseph [name], male    DOB: 07/28/1991  MRN: [id number]  Virtual Visit via Telephone Note  I connected with Joseph [name] on 08/23/19 at  2:30 PM EDT by telephone and verified that I am speaking with the correct person using two identifiers.   I discussed the limitations, risks, security and privacy concerns of performing an evaluation and management service by telephone and the availability of in person appointments. I also discussed with the patient that there may be a patient responsible charge related to this service. The patient expressed understanding and agreed to proceed.  Patient Location: [employer]ician[employer]ider Location: [facility] office Others participating in call: Call initiated by [place]ubia Lisbon, CMA who then transferred the call to me.  Hospital[facility] Dates of admission: 07/22/2019-07/23/2019 Diagnosis: Principal problem breakthrough seizure; active problems- hyperglycemia without diagnosis of diabetes mellitus, normocytic anemia, encounter for screening for HIV  History of Present Illness:       28 yo male who is seen to establish care and he is also status post [facility] from 07/22/2019 through 07/23/2019.  Patient reports that he has a known seizure disorder but was unable to get an appointment with neurology and did not have a primary care physician therefore he was taking less than the prescribed dose of his seizure medication.  Patient recalls being on a work break and being outside of the company where he works that [employer].  Patient is not sure what happened but states that he was found on the ground by a coworker and EMS was called to take him to the hospital. (Per hospital notes, patient appeared to be in a post ictal state upon examination by EMS and at the emergency department patient had blood sugar of 67 which dropped to 40 and he continued to be hypoglycemic despite receiving oral  glucose and food.  Patient's glucose was as low as 29.  He was tested for COVID-19 which was negative and had negative head CT and negative C-spine films for any acute findings.  Patient's Depakote level was low and he was loaded with the thousand milligrams of valproate along with dextrose gel, D50 and 1 L of fluids the following day he was given his normal dose of valproic acid prior to discharge to home.)       Patient now states that he feels well and has had no further seizures since hospital discharge.  He has not yet made a follow-up appointment with neurology.  He reports that he continues to drive himself to work.  He denies any headaches or dizziness.  He did have a hematoma on his posterior scalp after his seizure per EMS/ED report.  Patient states that this has gone away.  He denies any neck pain.  No other muscle or joint pain.  He denies shortness of breath or cough, no abdominal pain-no nausea or vomiting, no chest pain or palpitations.  He has had no other symptoms of low blood sugar such as confusion, sweating or feeling jittery.  He has no known history of diabetes.  He denies any unusual bruising or bleeding.   Past Medical History:  Diagnosis Date  . Seizures (HCC)     Past Surgical History:  Procedure Laterality Date  . FINGER SURGERY      No family history on file.  Social History   Tobacco Use  . Smoking status: Never Smoker  . Smokeless tobacco: Never Used  Substance Use Topics  . Alcohol use: No  . Drug use: No     Allergies  Allergen Reactions  . Mushroom Extract Complex Anaphylaxis  . Dust Mite Extract   . Shellfish Allergy Other (See Comments)    Itchy throat  . Tomato Rash       Observations/Objective: No vital signs or physical exam conducted as visit was done via telephone  Assessment and Plan: 1. Seizure disorder [facility]) Per hospital records, patient was admitted from 07/22/2019 through 07/23/2019 at [facility] for breakthrough  seizure.  Patient reports that he has a known seizure disorder but had been trying to stretch out his medications as he has been unable to obtain refills from his neurologist and he was not established with a primary care physician.  New prescription sent in for patient's Depakote and patient will be referred to neurology for further evaluation and treatment.  Patient is reminded that after a seizure that he should follow driving restrictions.  Patient does not feel that he needs to have any restrictions on his ability to drive as he states that he knows what caused him to have a seizure which is the fact that he was trying to ration out his medication.  Patient was told that he can discuss this with the neurologist but that there are state regulations/DMV regulations regarding driving after a seizure has occurred.  Referrals were placed for patient to follow-up with neurology and additionally as patient states that he is also having some issues with financial difficulty, he will be referred to social work for help with resources.  Importance of remaining compliant with medications and medical follow-up were discussed with the patient. - divalproex (DEPAKOTE ER) 500 MG 24 hr tablet; Take 1 tablet (500 mg total) by mouth 2 (two) times daily.  Dispense: 60 tablet; Refill: 3 - Ambulatory referral to Neurology - Ambulatory referral to Social Work  Follow Up Instructions:Return in about 4 months (around 12/23/2019) for Follow-up 4 to 6 months and as needed.    I discussed the assessment and treatment plan with the patient. The patient was provided an opportunity to ask questions and all were answered. The patient agreed with the plan and demonstrated an understanding of the instructions.   The patient was advised to call back or seek an in-person evaluation if the symptoms worsen or if the condition fails to improve as anticipated.  I provided 16 minutes of non-face-to-face time during this  encounter.   Antony Blackbird, MD

## 2019-08-23 NOTE — Progress Notes (Signed)
Patient verified DOB Patient has taken medication today. Patient denies pain at thist ime.

## 2019-08-28 ENCOUNTER — Ambulatory Visit: Payer: Self-pay

## 2019-08-29 ENCOUNTER — Telehealth: Payer: Self-pay | Admitting: Licensed Clinical Social Worker

## 2019-08-29 NOTE — Telephone Encounter (Signed)
Call placed to patient in attempt to follow up with [facility] consult from PCP. LCSW was unable to leave a message due to voicemail not being set up.

## 2019-09-08 ENCOUNTER — Encounter: Payer: Self-pay | Admitting: Family Medicine

## 2019-10-07 ENCOUNTER — Ambulatory Visit: Payer: Self-pay | Admitting: Neurology

## 2019-10-07 ENCOUNTER — Telehealth: Payer: Self-pay | Admitting: *Deleted

## 2019-10-07 NOTE — Progress Notes (Deleted)
[facility]    Provider:  Dr Lucia Gaskins Requesting Provider: [facility], [facility] Primary Care Provider:  [facility], [facility]  CC:  ***  HPI:  Joseph [name] is a 28 y.o. male here as requested by [facility] for breakthrough seizure in the setting of medication noncompliance.  Patient stated in the emergency room he was not taking Depakote regularly, he did not have a primary care or neurologist to refill and he was trying to make it last longer.  Also significant hypoglycemia in the emergency room, Depakote level subtherapeutic at 21, head CT negative for acute finding.  Reviewed notes, labs and imaging from outside physicians, which showed:    Ct showed No acute intracranial abnormalities including mass lesion or mass effect, hydrocephalus, extra-axial fluid collection, midline shift, hemorrhage, or acute infarction, large ischemic events (personally reviewed images)     I reviewed notes from emergency room.  Patient with a past medical history of seizures on Depakote presented with a seizure.  He missed his medications that morning and had a seizure at work.  Valproic acid level was low and was repleted with IV loading dose.  Imaging unremarkable.  Labs were significant for hyperglycemia.  Patient CBG dropped to 30 despite 3 slices of pizza and 2 cups of orange juice, he was given several amps of D50, he was admitted.  Patient works at a [employer].  Per EMS he was post ictal and had a hematoma to his posterior head.  He had been missing his seizure doses not taking Depakote regularly.  Review of Systems: Patient complains of symptoms per HPI as well as the following symptoms ***. Pertinent negatives and positives per HPI. All others negative.   Social History   Socioeconomic History  . Marital status: Single    Spouse name: Not on file  . Number of children: Not on file  . Years of education: Not on file  . Highest education level:  Not on file  Occupational History  . Not on file  Social Needs  . Financial resource strain: Not on file  . Food insecurity    Worry: Not on file    Inability: Not on file  . Transportation needs    Medical: Not on file    Non-medical: Not on file  Tobacco Use  . Smoking status: Never Smoker  . Smokeless tobacco: Never Used  Substance and Sexual Activity  . Alcohol use: No  . Drug use: No  . Sexual activity: Not Currently  Lifestyle  . Physical activity    Days per week: Not on file    Minutes per session: Not on file  . Stress: Not on file  Relationships  . Social Musician on phone: Not on file    Gets together: Not on file    Attends religious service: Not on file    Active member of club or organization: Not on file    Attends meetings of clubs or organizations: Not on file    Relationship status: Not on file  . Intimate partner violence    Fear of current or ex partner: Not on file    Emotionally abused: Not on file    Physically abused: Not on file    Forced sexual activity: Not on file  Other Topics Concern  . Not on file  Social History Narrative  . Not on file    Family History  Problem Relation Age of Onset  .  Hypertension Father   . Diabetes Maternal Grandfather     Past Medical History:  Diagnosis Date  . Seizures [facility])     Patient Active Problem List   Diagnosis Date Noted  . Hypoglycemia without diagnosis of diabetes mellitus 07/23/2019  . Normocytic anemia 07/23/2019  . Encounter for screening for HIV 07/23/2019  . Breakthrough seizure ([place]) 07/22/2019    Past Surgical History:  Procedure Laterality Date  . BRAIN SURGERY     surgery to find the cause of his seizures  . FINGER SURGERY      Current Outpatient Medications  Medication Sig Dispense Refill  . divalproex (DEPAKOTE ER) 500 MG 24 hr tablet Take 1 tablet (500 mg total) by mouth 2 (two) times daily. 60 tablet 3   No current facility-administered medications for  this visit.     Allergies as of 10/07/2019 - Review Complete 09/08/2019  Allergen Reaction Noted  . Mushroom extract complex Anaphylaxis 07/22/2019  . Dust mite extract  08/21/2017  . Shellfish allergy Other (See Comments) 11/28/2013  . Tomato Rash 01/26/2018    Vitals: There were no vitals taken for this visit. Last Weight:  Wt Readings from Last 1 Encounters:  01/26/18 200 lb (90.7 kg)   Last Height:   Ht Readings from Last 1 Encounters:  01/26/18 6\' 5"  (1.956 m)     Physical exam: Exam: Gen: NAD, conversant, well nourised, obese, well groomed                     CV: RRR, no MRG. No Carotid Bruits. No peripheral edema, warm, nontender Eyes: Conjunctivae clear without exudates or hemorrhage  Neuro: Detailed Neurologic Exam  Speech:    Speech is normal; fluent and spontaneous with normal comprehension.  Cognition:    The patient is oriented to person, place, and time;     recent and remote memory intact;     language fluent;     normal attention, concentration,     fund of knowledge Cranial Nerves:    The pupils are equal, round, and reactive to light. The fundi are normal and spontaneous venous pulsations are present. Visual fields are full to finger confrontation. Extraocular movements are intact. Trigeminal sensation is intact and the muscles of mastication are normal. The face is symmetric. The palate elevates in the midline. Hearing intact. Voice is normal. Shoulder shrug is normal. The tongue has normal motion without fasciculations.   Coordination:    Normal finger to nose and heel to shin. Normal rapid alternating movements.   Gait:    Heel-toe and tandem gait are normal.   Motor Observation:    No asymmetry, no atrophy, and no involuntary movements noted. Tone:    Normal muscle tone.    Posture:    Posture is normal. normal erect    Strength:    Strength is V/V in the upper and lower limbs.      Sensation: intact to LT     Reflex Exam:  DTR's:     Deep tendon reflexes in the upper and lower extremities are normal bilaterally.   Toes:    The toes are downgoing bilaterally.   Clonus:    Clonus is absent.    Assessment/Plan:    No orders of the defined types were placed in this encounter.  No orders of the defined types were placed in this encounter.   Cc: [facility], [place]  [place], [place]  [facility] [address] [place], [place] [address]  Phone [phone] Fax [phone]

## 2019-10-07 NOTE — Telephone Encounter (Signed)
Pt no showed new pt appt today. 

## 2019-10-08 ENCOUNTER — Encounter: Payer: Self-pay | Admitting: Neurology

## 2020-04-09 ENCOUNTER — Other Ambulatory Visit: Payer: Self-pay | Admitting: Family Medicine

## 2020-04-09 DIAGNOSIS — G40909 Epilepsy, unspecified, not intractable, without status epilepticus: Secondary | ICD-10-CM

## 2020-04-16 ENCOUNTER — Other Ambulatory Visit: Payer: Self-pay | Admitting: Family Medicine

## 2020-04-16 DIAGNOSIS — G40909 Epilepsy, unspecified, not intractable, without status epilepticus: Secondary | ICD-10-CM

## 2020-07-15 ENCOUNTER — Encounter (HOSPITAL_COMMUNITY): Payer: Self-pay | Admitting: Emergency Medicine

## 2020-07-15 ENCOUNTER — Emergency Department (HOSPITAL_COMMUNITY)
Admission: EM | Admit: 2020-07-15 | Discharge: 2020-07-15 | Disposition: A | Discharge status: Home / Self Care | Payer: Self-pay | Attending: Emergency Medicine | Admitting: Emergency Medicine

## 2020-07-15 DIAGNOSIS — Z5321 Procedure and treatment not carried out due to patient leaving prior to being seen by health care provider: Secondary | ICD-10-CM | POA: Insufficient documentation

## 2020-07-15 DIAGNOSIS — R569 Unspecified convulsions: Secondary | ICD-10-CM | POA: Insufficient documentation

## 2020-07-15 LAB — BASIC METABOLIC PANEL
Anion gap: 9 (ref 5–15)
BUN: 16 mg/dL (ref 6–20)
CO2: 28 mmol/L (ref 22–32)
Calcium: 9 mg/dL (ref 8.9–10.3)
Chloride: 100 mmol/L (ref 98–111)
Creatinine, Ser: 1.05 mg/dL (ref 0.61–1.24)
GFR calc Af Amer: >60 mL/min (ref >60)
GFR calc non Af Amer: >60 mL/min (ref >60)
Glucose, Bld: 100 mg/dL — ABNORMAL HIGH (ref 70–99)
Potassium: 3.8 mmol/L (ref 3.5–5.1)
Sodium: 137 mmol/L (ref 135–145)

## 2020-07-15 LAB — CBC
HCT: 44.5 % (ref 39.0–52.0)
Hemoglobin: 13.6 g/dL (ref 13.0–17.0)
MCH: 25.3 pg — ABNORMAL LOW (ref 26.0–34.0)
MCHC: 30.6 g/dL (ref 30.0–36.0)
MCV: 82.7 fL (ref 80.0–100.0)
Platelets: 250 10*3/uL (ref 150–400)
RBC: 5.38 MIL/uL (ref 4.22–5.81)
RDW: 12.5 % (ref 11.5–15.5)
WBC: 4.5 10*3/uL (ref 4.0–10.5)
nRBC: 0 % (ref 0.0–0.2)

## 2020-07-15 NOTE — ED Notes (Signed)
Patient reports he will let staff know if he plans to leave so that IV can be removed.

## 2020-07-15 NOTE — ED Notes (Signed)
Went in the room to get vitals and patient was getting fast food.  I explained to him that you are not supposed to eat or drink until you see a MD and patient just was like ok and kept eating.  I explained to patient that I need his vitals and he stated that he just got them on the ambulance.

## 2020-07-15 NOTE — ED Notes (Signed)
Patient in lobby requesting IV removal. IV removed by NT.

## 2020-07-15 NOTE — ED Triage Notes (Addendum)
Per EMS, patient from [facility], witnessed seizure lasting approximately two minutes. Hx epilepsy. A&Ox1 and post ictal upon EMS arrival. A&Ox4 at this time. Reports taking medicine as prescribed.  18g L FA

## 2020-08-06 ENCOUNTER — Emergency Department (HOSPITAL_COMMUNITY): Payer: Self-pay

## 2020-08-06 ENCOUNTER — Encounter (HOSPITAL_COMMUNITY): Payer: Self-pay

## 2020-08-06 ENCOUNTER — Emergency Department (HOSPITAL_COMMUNITY)
Admission: EM | Admit: 2020-08-06 | Discharge: 2020-08-07 | Disposition: A | Discharge status: Home / Self Care | Payer: Self-pay | Attending: Emergency Medicine | Admitting: Emergency Medicine

## 2020-08-06 ENCOUNTER — Other Ambulatory Visit: Payer: Self-pay

## 2020-08-06 DIAGNOSIS — Z91199 Patient's noncompliance with other medical treatment and regimen due to unspecified reason: Secondary | ICD-10-CM

## 2020-08-06 DIAGNOSIS — R519 Headache, unspecified: Secondary | ICD-10-CM | POA: Insufficient documentation

## 2020-08-06 DIAGNOSIS — Z9119 Patient's noncompliance with other medical treatment and regimen: Secondary | ICD-10-CM

## 2020-08-06 DIAGNOSIS — G40909 Epilepsy, unspecified, not intractable, without status epilepticus: Secondary | ICD-10-CM | POA: Insufficient documentation

## 2020-08-06 LAB — I-STAT CHEM 8, ED
BUN: 16 mg/dL (ref 6–20)
Calcium, Ion: 1.19 mmol/L (ref 1.15–1.40)
Chloride: 100 mmol/L (ref 98–111)
Creatinine, Ser: 0.8 mg/dL (ref 0.61–1.24)
Glucose, Bld: 87 mg/dL (ref 70–99)
HCT: 40 % (ref 39.0–52.0)
Hemoglobin: 13.6 g/dL (ref 13.0–17.0)
Potassium: 3.8 mmol/L (ref 3.5–5.1)
Sodium: 138 mmol/L (ref 135–145)
TCO2: 30 mmol/L (ref 22–32)

## 2020-08-06 MED ORDER — DIVALPROEX SODIUM ER 500 MG PO TB24: 500.0000 mg | ORAL_TABLET | Freq: Two times a day (BID) | ORAL | 0 refills | Status: DC

## 2020-08-06 MED ORDER — DIVALPROEX SODIUM ER 500 MG PO TB24
500.0000 mg | ORAL_TABLET | Freq: Once | ORAL | Status: AC
  Administered 2020-08-06: 500 mg via ORAL
  Filled 2020-08-06: qty 1

## 2020-08-06 NOTE — Discharge Instructions (Addendum)
You need to take your medication every day.
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
## 2020-10-01 ENCOUNTER — Other Ambulatory Visit: Payer: Self-pay

## 2020-10-01 ENCOUNTER — Emergency Department (HOSPITAL_COMMUNITY)
Admission: EM | Admit: 2020-10-01 | Discharge: 2020-10-01 | Discharge status: Against Medical Advice | Payer: Medicaid Other | Attending: Emergency Medicine | Admitting: Emergency Medicine

## 2020-10-01 DIAGNOSIS — R569 Unspecified convulsions: Secondary | ICD-10-CM | POA: Insufficient documentation

## 2020-10-01 DIAGNOSIS — E162 Hypoglycemia, unspecified: Secondary | ICD-10-CM | POA: Insufficient documentation

## 2020-10-01 LAB — I-STAT CHEM 8, ED
BUN: 11 mg/dL (ref 6–20)
Calcium, Ion: 1.22 mmol/L (ref 1.15–1.40)
Chloride: 102 mmol/L (ref 98–111)
Creatinine, Ser: 0.8 mg/dL (ref 0.61–1.24)
Glucose, Bld: 70 mg/dL (ref 70–99)
HCT: 37 % — ABNORMAL LOW (ref 39.0–52.0)
Hemoglobin: 12.6 g/dL — ABNORMAL LOW (ref 13.0–17.0)
Potassium: 4.2 mmol/L (ref 3.5–5.1)
Sodium: 140 mmol/L (ref 135–145)
TCO2: 29 mmol/L (ref 22–32)

## 2020-10-01 LAB — CBG MONITORING, ED
Glucose-Capillary: 66 mg/dL — ABNORMAL LOW (ref 70–99)
Glucose-Capillary: 59 mg/dL — ABNORMAL LOW (ref 70–99)
Glucose-Capillary: 65 mg/dL — ABNORMAL LOW (ref 70–99)

## 2020-10-01 MED ORDER — LEVETIRACETAM IN NACL 1000 MG/100ML IV SOLN
1000.0000 mg | Freq: Once | INTRAVENOUS | Status: AC
  Administered 2020-10-01: 1000 mg via INTRAVENOUS
  Filled 2020-10-01: qty 100

## 2020-10-01 NOTE — ED Triage Notes (Signed)
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 Pt denies any pain at this time. Pt has hx of seizures. Pt was postictal. vss pt is alert and is slowly getting back to his norm.

## 2020-10-01 NOTE — ED Provider Notes (Signed)
[facility] EMERGENCY DEPARTMENT Provider Note   CSN: [id number] Arrival date & time: 10/01/20  1557     History Chief Complaint  Patient presents with  . Seizures    Joseph [name] is a 29 y.o. male.  Patient is a 29 year old male with a history of seizures and noncompliance with medication who presents after having a seizure.  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  Unclear how long the seizure lasted.  He had a postictal state and still is a little bit confused.  He is denying any injuries or current complaints of pain.  He says he did not take his medication today.  When I ask him if he has medication at home, he says now that he is out of it.  On his last visit he got a prescription for Depakote but it is unclear if he got this filled.        Past Medical History:  Diagnosis Date  . Seizures [facility])     Patient Active Problem List   Diagnosis Date Noted  . Hypoglycemia without diagnosis of diabetes mellitus 07/23/2019  . Normocytic anemia 07/23/2019  . Encounter for screening for HIV 07/23/2019  . Breakthrough seizure (HCC) 07/22/2019    Past Surgical History:  Procedure Laterality Date  . BRAIN SURGERY     surgery to find the cause of his seizures  . FINGER SURGERY         Family History  Problem Relation Age of Onset  . Hypertension Father   . Diabetes Maternal Grandfather     Social History   Tobacco Use  . Smoking status: Never Smoker  . Smokeless tobacco: Never Used  Vaping Use  . Vaping Use: Never used  Substance Use Topics  . Alcohol use: No  . Drug use: No    Home Medications Prior to Admission medications   Medication Sig Start Date End Date Taking? Authorizing Provider  divalproex (DEPAKOTE ER) 500 MG 24 hr tablet Take 1 tablet (500 mg total) by mouth 2 (two) times daily. 08/06/20 09/05/20  Cristina Gong, PA-C  divalproex (DEPAKOTE) 500 MG DR tablet  Take 1,000 mg by mouth daily.  05/04/20   [provider]    Allergies    Mushroom extract complex, Dust mite extract, Shellfish allergy, and Tomato  Review of Systems   Review of Systems  Constitutional: Negative for chills, diaphoresis, fatigue and fever.  HENT: Negative for congestion, rhinorrhea and sneezing.   Eyes: Negative.   Respiratory: Negative for cough, chest tightness and shortness of breath.   Cardiovascular: Negative for chest pain and leg swelling.  Gastrointestinal: Negative for abdominal pain, blood in stool, diarrhea, nausea and vomiting.  Genitourinary: Negative for difficulty urinating, flank pain, frequency and hematuria.  Musculoskeletal: Negative for arthralgias and back pain.  Skin: Negative for rash.  Neurological: Positive for seizures. Negative for dizziness, speech difficulty, weakness, numbness and headaches.    Physical Exam Updated Vital Signs BP (!) 110/56   Pulse (!) 57   Temp 97.6 F (36.4 C) (Oral)   Resp 14   SpO2 97%   Physical Exam Constitutional:      Appearance: He is well-developed.  HENT:     Head: Normocephalic and atraumatic.  Eyes:     Pupils: Pupils are equal, round, and reactive to light.  Cardiovascular:     Rate and Rhythm: Normal rate and regular rhythm.  Heart sounds: Normal heart sounds.  Pulmonary:     Effort: Pulmonary effort is normal. No respiratory distress.     Breath sounds: Normal breath sounds. No wheezing or rales.  Chest:     Chest wall: No tenderness.  Abdominal:     General: Bowel sounds are normal.     Palpations: Abdomen is soft.     Tenderness: There is no abdominal tenderness. There is no guarding or rebound.  Musculoskeletal:        General: Normal range of motion.     Cervical back: Normal range of motion and neck supple.  Lymphadenopathy:     Cervical: No cervical adenopathy.  Skin:    General: Skin is warm and dry.     Findings: No rash.  Neurological:     General: No focal  deficit present.     Mental Status: He is alert and oriented to person, place, and time.     ED Results / Procedures / Treatments   Labs (all labs ordered are listed, but only abnormal results are displayed) Labs Reviewed  CBG MONITORING, ED - Abnormal; Notable for the following components:      Result Value   Glucose-Capillary 66 (*)    All other components within normal limits  I-STAT CHEM 8, ED - Abnormal; Notable for the following components:   Hemoglobin 12.6 (*)    HCT 37.0 (*)    All other components within normal limits  CBG MONITORING, ED - Abnormal; Notable for the following components:   Glucose-Capillary 65 (*)    All other components within normal limits  CBG MONITORING, ED - Abnormal; Notable for the following components:   Glucose-Capillary 59 (*)    All other components within normal limits    EKG EKG Interpretation  Date/Time:  Thursday October 01 2020 15:58:28 EDT Ventricular Rate:  63 PR Interval:    QRS Duration: 101 QT Interval:  387 QTC Calculation: 397 R Axis:   68 Text Interpretation: Sinus rhythm RSR' in V1 or V2, probably normal variant ST elev, probable normal early repol pattern since last tracing no significant change Confirmed by Rolan Bucco [phone]) on 10/01/2020 5:55:49 PM   Radiology No results found.  Procedures Procedures (including critical care time)  Medications Ordered in ED Medications  levETIRAcetam (KEPPRA) IVPB 1000 mg/100 mL premix (0 mg Intravenous Stopped 10/01/20 1831)    ED Course  I have reviewed the triage vital signs and the nursing notes.  Pertinent labs & imaging results that were available during my care of the patient were reviewed by me and considered in my medical decision making (see chart for details).    MDM Rules/Calculators/A&P                          Patient is a 29 year old male who presents after having a seizure.  He has a history of noncompliance with his medications.  He got a refill on his  Depakote on a previous visit but he does not know if he got it filled.  He was monitored here in the ED until he was fully alert and oriented.  His blood sugar was slightly low.  He was encouraged to eat and drink but he did not cooperate with this.  His glucose on recheck was 59, little bit lower than his initial glucose.  I encouraged him to drink some juice.  We offered him multiple pain meds but he got irritated and said he  was ready to leave.  He was fully alert and oriented.  He is ambulating without difficulty.  He does not seem to be postictal.  He has been talking to his wife on the phone as well and he is not willing to stay any longer.  I did advise him that he could have another seizure, particularly with his blood sugar dropping.  He acknowledges this and will not stay for any further treatment.  He did ask for refill on his medication and I did advise him that I was going to give him a printout prescription for his Depakote but he need to have walking out prior to this happening.  He left AMA. Final Clinical Impression(s) / ED Diagnoses Final diagnoses:  Seizure (HCC)  Hypoglycemia    Rx / DC Orders ED Discharge Orders    None       Rolan Bucco, MD 10/01/20 2126

## 2020-10-01 NOTE — ED Notes (Addendum)
Pt not following any instructions refusing care and left without signing AMA. Dr. Fredderick Phenix and this RN oriented this pt that his CBG is low and that he can have another seizures, pt not listening to Dr. Fredderick Phenix this RN or his wife over the phone,  pt just stand up remove all wires out of him and IV and continue walking out to the lobby. Pt was AO x 4 prior to leave the hospital.

## 2020-10-01 NOTE — ED Notes (Signed)
cbg 65, orange juice and crackers given to the pt.

## 2020-10-01 NOTE — ED Notes (Signed)
Pt refuses to eat, CBG continues to go low. Pt removed his own IV and is not cooperative with care. ED provider notified.

## 2020-10-01 NOTE — ED Notes (Signed)
Please call wife vashti with an update  [phone]  also the pt will need a ride home  by taxi please contact social worker

## 2021-01-12 ENCOUNTER — Emergency Department (HOSPITAL_COMMUNITY)
Admission: EM | Admit: 2021-01-12 | Discharge: 2021-01-12 | Disposition: A | Discharge status: Home / Self Care | Payer: HRSA Program | Attending: Emergency Medicine | Admitting: Emergency Medicine

## 2021-01-12 ENCOUNTER — Other Ambulatory Visit: Payer: Self-pay

## 2021-01-12 ENCOUNTER — Other Ambulatory Visit (HOSPITAL_COMMUNITY): Payer: Self-pay | Admitting: Student

## 2021-01-12 ENCOUNTER — Emergency Department (HOSPITAL_COMMUNITY): Payer: HRSA Program

## 2021-01-12 DIAGNOSIS — I10 Essential (primary) hypertension: Secondary | ICD-10-CM | POA: Insufficient documentation

## 2021-01-12 DIAGNOSIS — Z5900 Homelessness unspecified: Secondary | ICD-10-CM

## 2021-01-12 DIAGNOSIS — R519 Headache, unspecified: Secondary | ICD-10-CM | POA: Diagnosis present

## 2021-01-12 DIAGNOSIS — U071 COVID-19: Secondary | ICD-10-CM | POA: Diagnosis not present

## 2021-01-12 LAB — TROPONIN I (HIGH SENSITIVITY)
Troponin I (High Sensitivity): 3 ng/L (ref <18)
Troponin I (High Sensitivity): 3 ng/L (ref <18)

## 2021-01-12 LAB — BASIC METABOLIC PANEL
Anion gap: 11 (ref 5–15)
BUN: 10 mg/dL (ref 6–20)
CO2: 25 mmol/L (ref 22–32)
Calcium: 9.1 mg/dL (ref 8.9–10.3)
Chloride: 99 mmol/L (ref 98–111)
Creatinine, Ser: 1 mg/dL (ref 0.61–1.24)
GFR, Estimated: >60 mL/min (ref >60)
Glucose, Bld: 78 mg/dL (ref 70–99)
Potassium: 4.1 mmol/L (ref 3.5–5.1)
Sodium: 135 mmol/L (ref 135–145)

## 2021-01-12 LAB — CBC
HCT: 38.5 % — ABNORMAL LOW (ref 39.0–52.0)
Hemoglobin: 12.4 g/dL — ABNORMAL LOW (ref 13.0–17.0)
MCH: 26.1 pg (ref 26.0–34.0)
MCHC: 32.2 g/dL (ref 30.0–36.0)
MCV: 80.9 fL (ref 80.0–100.0)
Platelets: 205 10*3/uL (ref 150–400)
RBC: 4.76 MIL/uL (ref 4.22–5.81)
RDW: 11.8 % (ref 11.5–15.5)
WBC: 4.1 10*3/uL (ref 4.0–10.5)
nRBC: 0 % (ref 0.0–0.2)

## 2021-01-12 LAB — SARS CORONAVIRUS 2 (TAT 6-24 HRS): SARS Coronavirus 2: POSITIVE — AB

## 2021-01-12 MED ORDER — DIVALPROEX SODIUM 500 MG PO DR TAB
500.0000 mg | DELAYED_RELEASE_TABLET | Freq: Two times a day (BID) | ORAL | 0 refills | Status: DC
Start: 2021-01-12 — End: 2021-01-12

## 2021-01-12 MED ORDER — DIPHENHYDRAMINE HCL 25 MG PO CAPS
25.0000 mg | ORAL_CAPSULE | Freq: Once | ORAL | Status: AC
  Administered 2021-01-12: 25 mg via ORAL
  Filled 2021-01-12: qty 1

## 2021-01-12 MED ORDER — IBUPROFEN 400 MG PO TABS
600.0000 mg | ORAL_TABLET | Freq: Once | ORAL | Status: AC
  Administered 2021-01-12: 600 mg via ORAL
  Filled 2021-01-12: qty 1

## 2021-01-12 MED FILL — DIVALPROEX SOD DR 500 MG TA: 500 | 30 days supply | Qty: 60 | Fill #0

## 2021-01-12 NOTE — Discharge Planning (Signed)
RNCM consulted regarding medication assistance of pt unable to afford medications.  RNCM utilized [facility] petty cash to cover $12 co-pay and filled through [facility].  Rx e-scribed to [facility] and delivered to pt prior to discharge home.  No further RNCM needs identified at this time.   

## 2021-01-12 NOTE — Discharge Instructions (Addendum)
You were evaluated in the emergency room today for your body aches and chills.  Your blood work, chest x-ray, EKG, and vital signs were very reassuring.  You have been tested for COVID-19, your test results should come back tomorrow.  You may call the emergency department for your results.  Return to the emergency department if you develop any chest pain, difficulty breathing, nausea/ vomiting that does not stop or any other new severe symptoms.  You have been prescribed Depakote, and provided with a prescription for the next 30 days.  Below is the contact information for [facility], the free clinic associated with [place].  You may follow-up with their office for refills of your seizure medication.   Below is the contact information for shelters in the area.  You may call them we can provide you a taxi ride or bus pass.  [facility] [place] [employer] [facility] [phone] Call for shelter availability.  You must have access to a phone so they can call you back.  [facility]) [address] [place] [phone] M-F 8:00-3:00; S-S 8:00-2:00  [facility] Shel[facility] (Men and Women) [address] [phone]  [facility] (Men and Women) [address] [place] [phone]  [employer] (Domestic Violence [facility]) [address] [place] [phone]  [facility] (Men) [address] [employer] [phone]  [employer] [facility] (Single women and women with children) [address] [place] [phone]

## 2021-01-12 NOTE — ED Notes (Signed)
E-signature pad unavailable at time of pt discharge. This RN discussed discharge materials with pt and answered all pt questions. Pt stated understanding of discharge material. ? ?

## 2021-01-12 NOTE — Progress Notes (Signed)
   01/12/21 1157  TOC ED Mini Assessment  TOC Time spent with patient (minutes): 30  PING Used in TOC Assessment No  Admission or Readmission Diverted Yes  Interventions which prevented an admission or readmission Medication Review;Follow-up medical appointment  What brought you to the Emergency Department?  cold symptoms  Barriers to Discharge Homeless with medical needs  Barrier interventions [facility]  Means of departure Public Transportation  Rilley Stash J. Lucretia Roers, RN, BSN, [place] [phone]  RNCM set up appointment with Bertram Denver, NP on 1/24@1 :30.  Spoke with pt at bedside and advised to please arrive 15 min early and take a picture ID and your current medications.  Pt verbalizes understanding of keeping appointment.

## 2021-01-12 NOTE — ED Triage Notes (Signed)
BIB EMS for body ache, headache, chills and left sided chest pain. Pt is homeless and denies any exposure to COVID. Pt has not been taking his medications d/t financial burden. Pt asking for assistance with medication and placement to a shelter.

## 2021-01-12 NOTE — Progress Notes (Signed)
CSW called [facility] to update about [facility].  Homelessness resources are on AVS once Pt has run out of nights at hotel.   CSW waiting to hear from [employer] for ride time.  Pt is on the list.

## 2021-01-12 NOTE — ED Provider Notes (Signed)
[facility] EMERGENCY DEPARTMENT Provider Note   CSN: [id number] Arrival date & time: 01/12/21  5462     History Chief Complaint  Patient presents with  . Chest Pain  . Generalized Body Aches  . Chills    Joseph [name] is a 30 y.o. homeless male who presents with concern for 24 hours of myalgias, headache, chills, with left-sided chest pain that began last night after he consumed mushrooms, to which he has an allergy. Endorses nausea, denies vomiting, diarrhea. Endorses SOB intermittently, denies palpitations.  He is not vaccinated to COVID-19, but is currently staying at a hotel as he is homeless.  He presents for concern regarding his symptoms, also for social work assistance in securing placement in a shelter as well as assistance with seizure medication prescriptions. He has difficulty obtaining transportation for medical evaluation, therefore does not have primary care doctor who can prescribe recurrent Depakote prescriptions. He comes to the ED for refills.  I have personally reviewed this patient's medical records.  History of seizure disorder on Depakote, he endorses history of hypertension but does not take any blood pressure medications daily.  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  HPI     Past Medical History:  Diagnosis Date  . Seizures [facility])     Patient Active Problem List   Diagnosis Date Noted  . Hypoglycemia without diagnosis of diabetes mellitus 07/23/2019  . Normocytic anemia 07/23/2019  . Encounter for screening for HIV 07/23/2019  . Breakthrough seizure (HCC) 07/22/2019    Past Surgical History:  Procedure Laterality Date  . BRAIN SURGERY     surgery to find the cause of his seizures  . FINGER SURGERY         Family History  Problem Relation Age of Onset  . Hypertension Father   . Diabetes Maternal Grandfather     Social History   Tobacco Use  . Smoking status: Never Smoker  . Smokeless tobacco:  Never Used  Vaping Use  . Vaping Use: Never used  Substance Use Topics  . Alcohol use: No  . Drug use: No    Home Medications Prior to Admission medications   Medication Sig Start Date End Date Taking? Authorizing Provider  divalproex (DEPAKOTE) 500 MG DR tablet Take 1 tablet (500 mg total) by mouth 2 (two) times daily. 01/12/21 02/11/21 Yes Preslea Rhodus R, PA-C    Allergies    Gadolinium, Mushroom extract complex, Fish allergy, Dust mite extract, Shellfish allergy, and Tomato  Review of Systems   Review of Systems  Constitutional: Positive for chills, fatigue and fever. Negative for activity change and appetite change.  HENT: Positive for sore throat.   Eyes: Negative.   Respiratory: Positive for cough and shortness of breath. Negative for chest tightness.   Cardiovascular: Positive for chest pain. Negative for palpitations and leg swelling.  Gastrointestinal: Positive for nausea. Negative for abdominal pain, diarrhea and vomiting.  Genitourinary: Negative.   Musculoskeletal: Positive for myalgias. Negative for neck pain and neck stiffness.  Skin: Negative.   Allergic/Immunologic: Negative.   Neurological: Positive for headaches. Negative for dizziness, syncope, facial asymmetry, weakness and light-headedness.  Psychiatric/Behavioral: Negative.     Physical Exam Updated Vital Signs BP 113/75   Pulse (!) 57   Temp 98.5 F (36.9 C) (Oral)   Resp 14   Ht 6\' 5"  (1.956 m)   Wt 83.9 kg   SpO2 100%   BMI 21.94 kg/m   Physical Exam Vitals and nursing  note reviewed.  Constitutional:      Appearance: He is normal weight. He is not ill-appearing.  HENT:     Head: Normocephalic and atraumatic.     Nose: Nose normal.     Mouth/Throat:     Mouth: Mucous membranes are moist.     Pharynx: Oropharynx is clear. Uvula midline. Posterior oropharyngeal erythema present. No oropharyngeal exudate.     Tonsils: No tonsillar exudate.  Eyes:     General:        Right eye: No  discharge.        Left eye: No discharge.     Extraocular Movements: Extraocular movements intact.     Conjunctiva/sclera: Conjunctivae normal.     Pupils: Pupils are equal, round, and reactive to light.  Neck:     Trachea: Trachea and phonation normal.  Cardiovascular:     Rate and Rhythm: Normal rate and regular rhythm.     Pulses: Normal pulses.          Radial pulses are 2+ on the right side and 2+ on the left side.       Dorsalis pedis pulses are 2+ on the right side and 2+ on the left side.     Heart sounds: Normal heart sounds. No murmur heard.   Pulmonary:     Effort: Pulmonary effort is normal. No respiratory distress.     Breath sounds: Normal breath sounds. No wheezing or rales.  Chest:     Chest wall: Tenderness present. No mass, crepitus or edema.    Abdominal:     General: Bowel sounds are normal. There is no distension.     Tenderness: There is no abdominal tenderness. There is no guarding or rebound.  Musculoskeletal:        General: No deformity.     Cervical back: Neck supple. No rigidity or crepitus. No pain with movement or muscular tenderness.     Right lower leg: No edema.     Left lower leg: No edema.  Lymphadenopathy:     Cervical: No cervical adenopathy.  Skin:    General: Skin is warm and dry.     Capillary Refill: Capillary refill takes less than 2 seconds.  Neurological:     General: No focal deficit present.     Mental Status: He is alert and oriented to person, place, and time. Mental status is at baseline.     Sensory: Sensation is intact.     Motor: Motor function is intact.     Gait: Gait is intact.  Psychiatric:        Mood and Affect: Mood normal. Affect is flat.     ED Results / Procedures / Treatments   Labs (all labs ordered are listed, but only abnormal results are displayed) Labs Reviewed  CBC - Abnormal; Notable for the following components:      Result Value   Hemoglobin 12.4 (*)    HCT 38.5 (*)    All other components  within normal limits  SARS CORONAVIRUS 2 (TAT 6-24 HRS)  BASIC METABOLIC PANEL  TROPONIN I (HIGH SENSITIVITY)  TROPONIN I (HIGH SENSITIVITY)    EKG EKG Interpretation  Date/Time:  Tuesday January 12 2021 08:55:10 EST Ventricular Rate:  80 PR Interval:    QRS Duration: 85 QT Interval:  340 QTC Calculation: 393 R Axis:   70 Text Interpretation: Sinus rhythm ST elev, probable normal early repol pattern NSR, no STEMI Confirmed by Coralee Pesa ([id number]) on 01/12/2021 9:58:58 AM  Radiology DG Chest Portable 1 View  Result Date: 01/12/2021 CLINICAL DATA:  sob EXAM: PORTABLE CHEST 1 VIEW COMPARISON:  05/25/2020 and prior. FINDINGS: No focal consolidation. No pneumothorax or pleural effusion. Cardiomediastinal silhouette is within normal limits. No acute osseous abnormality. IMPRESSION: No focal airspace disease. Electronically Signed   By: Stana Bunting M.D.   On: 01/12/2021 10:29    Procedures Procedures (including critical care time)  Medications Ordered in ED Medications  diphenhydrAMINE (BENADRYL) capsule 25 mg (25 mg Oral Given 01/12/21 1031)  ibuprofen (ADVIL) tablet 600 mg (600 mg Oral Given 01/12/21 1031)    ED Course  I have reviewed the triage vital signs and the nursing notes.  Pertinent labs & imaging results that were available during my care of the patient were reviewed by me and considered in my medical decision making (see chart for details).    MDM Rules/Calculators/A&P                         30 year old male presents with myalgias, chills, nausea, headache, chest pain, shortness of breath 24 hours.  He is unvaccinated against COVID-19.  Additionally consumed mushrooms last night, to which he has an allergy.  Endorses "scratchy/itchy throat".  Patient's vital signs are normal on intake.  Physical exam is reassuring.  Cardiopulmonary exam is normal.  There is mild left-sided chest tenderness to palpation without ecchymosis, erythema, edema, crepitus, or  deformity.  Abdominal exam is benign.  Mild posterior pharyngeal erythema.  Proceed with chest x-ray, basic laboratory studies, troponin, and COVID-19 test.  Will administer Benadryl for itchy throat in context of consumption of foods to which the patient has an allergy.  Analgesia offered for myalgias.  EKG with NSR, CXR without acute cardiopulmonary disease, CBC with mild anemia, Hb 12.4 (previously 12.6). BMP unremarkable, troponin negative, 3. COVID test in process. He may call the ED for results tomorrow. Patient reevaluated, throat symptoms are improved, myalgias improved.   Social work was consulted regarding housing and medication needs.  Per their recommendation, will prescribe patient's Depakote to transitions of care pharmacy, so that he may be provided his medication while he is in the emergency department.  Additionally they are securing transportation for him to a shelter.  Given reassuring physical exam, vital signs, laboratory and imaging studies, no further work-up is warranted in the emergency department at this time.  Tremon voiced understanding of his medical evaluation and treatment plan.  Each of his questions was answered to his expressed satisfaction.  Return precautions given.  Patient is stable and appropriate for discharge at this time.  This chart was dictated using voice recognition software, Dragon. Despite the best efforts of this provider to proofread and correct errors, errors may still occur which can change documentation meaning.  Final Clinical Impression(s) / ED Diagnoses Final diagnoses:  None    Rx / DC Orders ED Discharge Orders         Ordered    divalproex (DEPAKOTE) 500 MG DR tablet  2 times daily        01/12/21 1400           Layn Kye, Eugene Gavia, PA-C 01/12/21 1525    Rozelle Logan, DO 01/13/21 0845

## 2021-01-13 ENCOUNTER — Ambulatory Visit: Payer: Self-pay

## 2021-01-13 NOTE — Telephone Encounter (Signed)
Please see triage note of 01/13/2021

## 2021-01-13 NOTE — Telephone Encounter (Signed)
Returned PT call regarding COVID results.   Pt was seen at ED 1/18/2022t for COVID S/S, and Left side chest pain.  And was released.  Pt feels that his care was less than complete. S/S of cough, chills, fever, high BP and chest pain were not adequately addressed.    Pt was not given any medication for S/S. Pt was given Depakote for seizure disorder #60.  Pt is unable to purchase medications OTC.  Pt has no PCP, and no transportation. Pt advised to return to ED for further treatment.Pt states that he will most likely not go. Suggested that pt f/u with [facility]. Pt was wary of this suggestion as he had limited success in the past with HD.    Reason for Disposition . Chest pain or pressure  Answer Assessment - Initial Assessment Questions 1. COVID-19 DIAGNOSIS: "Who made your COVID-19 diagnosis?" "Was it confirmed by a positive lab test?" If not diagnosed by a HCP, ask "Are there lots of cases (community spread) where you live?" Note: See public health department website, if unsure.     ED 01/12/2021 2. COVID-19 EXPOSURE: "Was there any known exposure to COVID before the symptoms began?" CDC Definition of close contact: within 6 feet (2 meters) for a total of 15 minutes or more over a 24-hour period.     Unknown 3. ONSET: "When did the COVID-19 symptoms start?"     Unknown 4. WORST SYMPTOM: "What is your worst symptom?" (e.g., cough, fever, shortness of breath, muscle aches)     BA 5. COUGH: "Do you have a cough?" If Yes, ask: "How bad is the cough?"      Yes. Moderate 6. FEVER: "Do you have a fever?" If Yes, ask: "What is your temperature, how was it measured, and when did it start?"     Unknown 7. RESPIRATORY STATUS: "Describe your breathing?" (e.g., shortness of breath, wheezing, unable to speak)      Cough - no distress 8. BETTER-SAME-WORSE: "Are you getting better, staying the same or getting worse compared to yesterday?"  If getting worse, ask, "In what way?"     same 9.  HIGH RISK DISEASE: "Do you have any chronic medical problems?" (e.g., asthma, heart or lung disease, weak immune system, obesity, etc.)     Unknown 10. VACCINE: "Have you gotten the COVID-19 vaccine?" If Yes ask: "Which one, how many shots, when did you get it?"       No 11. PREGNANCY: "Is there any chance you are pregnant?" "When was your last menstrual period?"       NA 12. OTHER SYMPTOMS: "Do you have any other symptoms?"  (e.g., chills, fatigue, headache, loss of smell or taste, muscle pain, sore throat; new loss of smell or taste especially support the diagnosis of COVID-19)       Chills, BA, possible fever, increased BP And Chest pain  Protocols used: CORONAVIRUS (COVID-19) DIAGNOSED OR SUSPECTED-A-AH

## 2021-01-18 ENCOUNTER — Inpatient Hospital Stay: Payer: Medicaid Other | Admitting: Nurse Practitioner

## 2021-03-27 ENCOUNTER — Other Ambulatory Visit: Payer: Self-pay

## 2021-03-27 ENCOUNTER — Encounter (HOSPITAL_COMMUNITY): Payer: Self-pay

## 2021-03-27 ENCOUNTER — Emergency Department (HOSPITAL_COMMUNITY)
Admission: EM | Admit: 2021-03-27 | Discharge: 2021-03-27 | Disposition: A | Discharge status: Home / Self Care | Payer: Medicaid Other | Attending: Emergency Medicine | Admitting: Emergency Medicine

## 2021-03-27 DIAGNOSIS — R569 Unspecified convulsions: Secondary | ICD-10-CM | POA: Insufficient documentation

## 2021-03-27 DIAGNOSIS — Z5321 Procedure and treatment not carried out due to patient leaving prior to being seen by health care provider: Secondary | ICD-10-CM | POA: Insufficient documentation

## 2021-03-27 LAB — CBG MONITORING, ED: Glucose-Capillary: 74 mg/dL (ref 70–99)

## 2021-03-27 NOTE — ED Notes (Signed)
Patient did not want treatment and walked out.

## 2021-03-27 NOTE — ED Notes (Signed)
Pt currently refusing labs. RN offered to perform butterfly stick. Pt declined that as well. RN explained that labs are necessary to medically evaluate pt for his health and safety.

## 2021-03-27 NOTE — ED Triage Notes (Addendum)
Pt was found unconscious and EMS was called. Pt presented to be post ictal upon their arrival. They report he was A&Ox1 and now is A&Ox4. Pt currently ambulatory, denies dizziness and lightheadedness.  He reports missing 2 doses of his medication this week

## 2021-03-27 NOTE — ED Notes (Signed)
Pt declined to sign MSE waiver. He

## 2021-11-14 ENCOUNTER — Encounter (HOSPITAL_COMMUNITY): Payer: Self-pay | Admitting: Emergency Medicine

## 2021-11-14 ENCOUNTER — Emergency Department (HOSPITAL_COMMUNITY)
Admission: EM | Admit: 2021-11-14 | Discharge: 2021-11-15 | Disposition: A | Discharge status: Home / Self Care | Payer: Self-pay | Attending: Emergency Medicine | Admitting: Emergency Medicine

## 2021-11-14 ENCOUNTER — Other Ambulatory Visit: Payer: Self-pay

## 2021-11-14 DIAGNOSIS — S01511A Laceration without foreign body of lip, initial encounter: Secondary | ICD-10-CM | POA: Insufficient documentation

## 2021-11-14 DIAGNOSIS — X58XXXA Exposure to other specified factors, initial encounter: Secondary | ICD-10-CM | POA: Insufficient documentation

## 2021-11-14 DIAGNOSIS — Y92009 Unspecified place in unspecified non-institutional (private) residence as the place of occurrence of the external cause: Secondary | ICD-10-CM | POA: Insufficient documentation

## 2021-11-14 DIAGNOSIS — R569 Unspecified convulsions: Secondary | ICD-10-CM | POA: Insufficient documentation

## 2021-11-14 LAB — CBC WITH DIFFERENTIAL/PLATELET
Abs Immature Granulocytes: 0.01 10*3/uL (ref 0.00–0.07)
Basophils Absolute: 0 10*3/uL (ref 0.0–0.1)
Basophils Relative: 1 %
Eosinophils Absolute: 0.2 10*3/uL (ref 0.0–0.5)
Eosinophils Relative: 3 %
HCT: 44.8 % (ref 39.0–52.0)
Hemoglobin: 14 g/dL (ref 13.0–17.0)
Immature Granulocytes: 0 %
Lymphocytes Relative: 19 %
Lymphs Abs: 1.1 10*3/uL (ref 0.7–4.0)
MCH: 25.4 pg — ABNORMAL LOW (ref 26.0–34.0)
MCHC: 31.3 g/dL (ref 30.0–36.0)
MCV: 81.3 fL (ref 80.0–100.0)
Monocytes Absolute: 0.4 10*3/uL (ref 0.1–1.0)
Monocytes Relative: 7 %
Neutro Abs: 4.2 10*3/uL (ref 1.7–7.7)
Neutrophils Relative %: 70 %
Platelets: 227 10*3/uL (ref 150–400)
RBC: 5.51 MIL/uL (ref 4.22–5.81)
RDW: 11.9 % (ref 11.5–15.5)
WBC: 6 10*3/uL (ref 4.0–10.5)
nRBC: 0 % (ref 0.0–0.2)

## 2021-11-14 LAB — CBG MONITORING, ED: Glucose-Capillary: 121 mg/dL — ABNORMAL HIGH (ref 70–99)

## 2021-11-14 MED ORDER — SODIUM CHLORIDE 0.9 % IV BOLUS
1000.0000 mL | Freq: Once | INTRAVENOUS | Status: AC
  Administered 2021-11-15: 1000 mL via INTRAVENOUS

## 2021-11-14 MED ORDER — DIVALPROEX SODIUM 250 MG PO DR TAB
500.0000 mg | DELAYED_RELEASE_TABLET | Freq: Once | ORAL | Status: AC
  Administered 2021-11-15: 500 mg via ORAL
  Filled 2021-11-14: qty 2

## 2021-11-14 MED ORDER — LIDOCAINE-EPINEPHRINE (PF) 2 %-1:200000 IJ SOLN
20.0000 mL | Freq: Once | INTRAMUSCULAR | Status: AC
  Administered 2021-11-15: 20 mL via INTRADERMAL
  Filled 2021-11-14: qty 20

## 2021-11-14 NOTE — ED Triage Notes (Signed)
Patient reports seizure at home this afternoon , presents with upper lip laceration and posterior neck pain . Alert and oriented /respirations unlabored.

## 2021-11-15 ENCOUNTER — Emergency Department (HOSPITAL_COMMUNITY): Payer: Self-pay

## 2021-11-15 LAB — COMPREHENSIVE METABOLIC PANEL
ALT: 21 U/L (ref 0–44)
AST: 21 U/L (ref 15–41)
Albumin: 4.4 g/dL (ref 3.5–5.0)
Alkaline Phosphatase: 47 U/L (ref 38–126)
Anion gap: 8 (ref 5–15)
BUN: 15 mg/dL (ref 6–20)
CO2: 30 mmol/L (ref 22–32)
Calcium: 9.7 mg/dL (ref 8.9–10.3)
Chloride: 102 mmol/L (ref 98–111)
Creatinine, Ser: 0.9 mg/dL (ref 0.61–1.24)
GFR, Estimated: >60 mL/min (ref >60)
Glucose, Bld: 104 mg/dL — ABNORMAL HIGH (ref 70–99)
Potassium: 4.1 mmol/L (ref 3.5–5.1)
Sodium: 140 mmol/L (ref 135–145)
Total Bilirubin: 0.8 mg/dL (ref 0.3–1.2)
Total Protein: 7.8 g/dL (ref 6.5–8.1)

## 2021-11-15 LAB — VALPROIC ACID LEVEL: Valproic Acid Lvl: <10 ug/mL — ABNORMAL LOW (ref 50.0–100.0)

## 2021-11-15 MED ORDER — KETOROLAC TROMETHAMINE 15 MG/ML IJ SOLN
15.0000 mg | Freq: Once | INTRAMUSCULAR | Status: AC
  Administered 2021-11-15: 15 mg via INTRAVENOUS
  Filled 2021-11-15: qty 1

## 2021-11-15 MED ORDER — DIVALPROEX SODIUM 500 MG PO DR TAB: DELAYED_RELEASE_TABLET | Freq: Two times a day (BID) | ORAL | 0 refills | Status: DC

## 2021-11-15 NOTE — ED Provider Notes (Signed)
[facility] EMERGENCY DEPARTMENT Provider Note  CSN: [id number] Arrival date & time: 11/14/21 2247  Chief Complaint(s) Seizures  HPI Joseph [name] is a 30 y.o. male here for unwitnessed seizure at home. Found down by brother.    Seizures Seizure activity on arrival: no   Seizure type:  Unable to specify Initial focality:  Unable to specify Postictal symptoms: somnolence   Return to baseline: yes   Duration: unsure. Timing:  Unable to specify Context: medical non-compliance   History of seizures: yes    Sustained facial abrasions and upper lip laceration.  Past Medical History Past Medical History:  Diagnosis Date   Seizures [facility])    Patient Active Problem List   Diagnosis Date Noted   Hypoglycemia without diagnosis of diabetes mellitus 07/23/2019   Normocytic anemia 07/23/2019   Encounter for screening for HIV 07/23/2019   Breakthrough seizure (HCC) 07/22/2019   Home Medication(s) Prior to Admission medications   Medication Sig Start Date End Date Taking? Authorizing Provider  divalproex (DEPAKOTE) 500 MG DR tablet TAKE 1 TABLET (500 MG TOTAL) BY MOUTH TWO TIMES DAILY. 11/15/21 11/15/22  Nira Conn, MD                                                                                                                                    Past Surgical History Past Surgical History:  Procedure Laterality Date   BRAIN SURGERY     surgery to find the cause of his seizures   FINGER SURGERY     Family History Family History  Problem Relation Age of Onset   Hypertension Father    Diabetes Maternal Grandfather     Social History Social History   Tobacco Use   Smoking status: Never   Smokeless tobacco: Never  Vaping Use   Vaping Use: Never used  Substance Use Topics   Alcohol use: No   Drug use: No   Allergies Gadolinium, Mushroom extract complex, Fish allergy, Dust mite extract, Shellfish allergy, and Tomato  Review of  Systems Review of Systems  Neurological:  Positive for seizures.  All other systems are reviewed and are negative for acute change except as noted in the HPI  Physical Exam Vital Signs  I have reviewed the triage vital signs BP 140/79 (BP Location: Right Arm)   Pulse 67   Temp 98 F (36.7 C)   Resp 16   SpO2 98%   Physical Exam Constitutional:      General: He is not in acute distress.    Appearance: He is well-developed. He is not diaphoretic.  HENT:     Head: Normocephalic.     Right Ear: External ear normal.     Left Ear: External ear normal.     Mouth/Throat:     Mouth: Lacerations present.   Eyes:     General: No scleral icterus.       Right eye:  No discharge.        Left eye: No discharge.     Conjunctiva/sclera: Conjunctivae normal.     Pupils: Pupils are equal, round, and reactive to light.  Cardiovascular:     Rate and Rhythm: Regular rhythm.     Pulses:          Radial pulses are 2+ on the right side and 2+ on the left side.       Dorsalis pedis pulses are 2+ on the right side and 2+ on the left side.     Heart sounds: Normal heart sounds. No murmur heard.   No friction rub. No gallop.  Pulmonary:     Effort: Pulmonary effort is normal. No respiratory distress.     Breath sounds: Normal breath sounds. No stridor.  Abdominal:     General: There is no distension.     Palpations: Abdomen is soft.     Tenderness: There is no abdominal tenderness.  Musculoskeletal:     Cervical back: Normal range of motion and neck supple. No bony tenderness. Spinous process tenderness and muscular tenderness present.     Thoracic back: No bony tenderness.     Lumbar back: No bony tenderness.     Comments: Clavicle stable. Chest stable to AP/Lat compression. Pelvis stable to Lat compression. No obvious extremity deformity. No chest or abdominal wall contusion.  Skin:    General: Skin is warm.  Neurological:     Mental Status: He is alert and oriented to person, place,  and time.     GCS: GCS eye subscore is 4. GCS verbal subscore is 5. GCS motor subscore is 6.     Comments: Moving all extremities     ED Results and Treatments Labs (all labs ordered are listed, but only abnormal results are displayed) Labs Reviewed  COMPREHENSIVE METABOLIC PANEL - Abnormal; Notable for the following components:      Result Value   Glucose, Bld 104 (*)    All other components within normal limits  CBC WITH DIFFERENTIAL/PLATELET - Abnormal; Notable for the following components:   MCH 25.4 (*)    All other components within normal limits  VALPROIC ACID LEVEL - Abnormal; Notable for the following components:   Valproic Acid Lvl <10 (*)    All other components within normal limits  CBG MONITORING, ED - Abnormal; Notable for the following components:   Glucose-Capillary 121 (*)    All other components within normal limits                                                                                                                         EKG  EKG Interpretation  Date/Time:  Sunday November 14 2021 23:38:05 EST Ventricular Rate:  59 PR Interval:  159 QRS Duration: 101 QT Interval:  378 QTC Calculation: 375 R Axis:   67 Text Interpretation: Sinus rhythm RSR' in V1 or V2, probably normal variant ST elev, probable normal early repol pattern No  acute changes Confirmed by Drema Pry [id number]) on 11/14/2021 11:42:16 PM       Radiology CT Head Wo Contrast  Addendum Date: 11/15/2021   ADDENDUM REPORT: 11/15/2021 03:07 ADDENDUM: Comparison is made to cervical spine CT of 07/22/2019. The shallow disc protrusion described above at C3-C4 was not seen at that time. In all other respects no further changes are noted. Electronically Signed   By: Almira Bar M.D.   On: 11/15/2021 03:07   Result Date: 11/15/2021 CLINICAL DATA:  Seizure with head and neck trauma. EXAM: CT HEAD WITHOUT CONTRAST CT CERVICAL SPINE WITHOUT CONTRAST TECHNIQUE: Multidetector CT imaging of the  head and cervical spine was performed following the standard protocol without intravenous contrast. Multiplanar CT image reconstructions of the cervical spine were also generated. COMPARISON:  Head CT of 08/06/2020. No prior cross-sectional imaging of the spine. FINDINGS: CT HEAD FINDINGS Brain: No evidence of acute infarction, hemorrhage, hydrocephalus, extra-axial collection or mass lesion/mass effect. Vascular: No hyperdense vessel or unexpected calcification. Skull: Normal. Negative for fracture or focal lesion. Sinuses/Orbits: No acute finding. Other: None. CT CERVICAL SPINE FINDINGS Alignment: There is a slight cervical dextroscoliosis. There is no AP listhesis or other abnormal alignment. C1 is normally positioned on C2. Skull base and vertebrae: There is normal bone mineralization with no evidence of fractures, with preservation of the normal vertebral and disc heights. There is early narrowing and spurring of the anterior atlantodental joint. No focal bone lesion is seen. Soft tissues and spinal canal: No prevertebral fluid or swelling. No visible canal hematoma. Disc levels: All discs are normal in heights. There is a shallow posterior disc protrusion mildly effacing the ventral CSF at C3-4 without mass effect. There is a slight disc bulge at C4-5. Other levels do not show significant soft tissue or bony encroachment on the thecal sac. There are trace facet spurs at C3-4, C4-5 and C5-6 but no levels show significant foraminal encroachment. Upper chest: Negative. Other:  none. IMPRESSION: 1. No acute intracranial CT findings or depressed skull fracture. 2. Slight cervical dextroscoliosis without evidence of fractures or listhesis. Trace facet spurring without foraminal stenosis. 3. Shallow posterior disc protrusion C3-4 without mass effect on the cord. 4. Slight disc bulge C4-5. Electronically Signed: By: Almira Bar M.D. On: 11/15/2021 01:15   CT Cervical Spine Wo Contrast  Addendum Date: 11/15/2021    ADDENDUM REPORT: 11/15/2021 03:07 ADDENDUM: Comparison is made to cervical spine CT of 07/22/2019. The shallow disc protrusion described above at C3-C4 was not seen at that time. In all other respects no further changes are noted. Electronically Signed   By: Almira Bar M.D.   On: 11/15/2021 03:07   Result Date: 11/15/2021 CLINICAL DATA:  Seizure with head and neck trauma. EXAM: CT HEAD WITHOUT CONTRAST CT CERVICAL SPINE WITHOUT CONTRAST TECHNIQUE: Multidetector CT imaging of the head and cervical spine was performed following the standard protocol without intravenous contrast. Multiplanar CT image reconstructions of the cervical spine were also generated. COMPARISON:  Head CT of 08/06/2020. No prior cross-sectional imaging of the spine. FINDINGS: CT HEAD FINDINGS Brain: No evidence of acute infarction, hemorrhage, hydrocephalus, extra-axial collection or mass lesion/mass effect. Vascular: No hyperdense vessel or unexpected calcification. Skull: Normal. Negative for fracture or focal lesion. Sinuses/Orbits: No acute finding. Other: None. CT CERVICAL SPINE FINDINGS Alignment: There is a slight cervical dextroscoliosis. There is no AP listhesis or other abnormal alignment. C1 is normally positioned on C2. Skull base and vertebrae: There is normal bone mineralization with no evidence  of fractures, with preservation of the normal vertebral and disc heights. There is early narrowing and spurring of the anterior atlantodental joint. No focal bone lesion is seen. Soft tissues and spinal canal: No prevertebral fluid or swelling. No visible canal hematoma. Disc levels: All discs are normal in heights. There is a shallow posterior disc protrusion mildly effacing the ventral CSF at C3-4 without mass effect. There is a slight disc bulge at C4-5. Other levels do not show significant soft tissue or bony encroachment on the thecal sac. There are trace facet spurs at C3-4, C4-5 and C5-6 but no levels show significant  foraminal encroachment. Upper chest: Negative. Other:  none. IMPRESSION: 1. No acute intracranial CT findings or depressed skull fracture. 2. Slight cervical dextroscoliosis without evidence of fractures or listhesis. Trace facet spurring without foraminal stenosis. 3. Shallow posterior disc protrusion C3-4 without mass effect on the cord. 4. Slight disc bulge C4-5. Electronically Signed: By: Almira Bar M.D. On: 11/15/2021 01:15    Pertinent labs & imaging results that were available during my care of the patient were reviewed by me and considered in my medical decision making (see MDM for details).  Medications Ordered in ED Medications  lidocaine-EPINEPHrine (XYLOCAINE W/EPI) 2 %-1:200000 (PF) injection 20 mL (has no administration in time range)  ketorolac (TORADOL) 15 MG/ML injection 15 mg (has no administration in time range)  sodium chloride 0.9 % bolus 1,000 mL (1,000 mLs Intravenous New Bag/Given 11/15/21 0001)  divalproex (DEPAKOTE) DR tablet 500 mg (500 mg Oral Given 11/15/21 0003)                                                                                                                                     Procedures Procedures  (including critical care time)  Medical Decision Making / ED Course I have reviewed the nursing notes for this encounter and the patient's prior records (if available in EHR or on provided paperwork).  ENVER MELLER was evaluated in Emergency Department on 11/15/2021 for the symptoms described in the history of present illness. He was evaluated in the context of the global COVID-19 pandemic, which necessitated consideration that the patient might be at risk for infection with the SARS-CoV-2 virus that causes COVID-19. Institutional protocols and algorithms that pertain to the evaluation of patients at risk for COVID-19 are in a state of rapid change based on information released by regulatory bodies including the CDC and federal and state organizations.  These policies and algorithms were followed during the patient's care in the ED.     Seizure in setting of noncompliance. At basline. Lip laceration irrigated and closed by APP. Labs reassuring Imaging notable for new mild cervical herniated disc. No spinal cord compression.  Pertinent labs & imaging results that were available during my care of the patient were reviewed by me and considered in my medical decision making:  The patient appears reasonably screened and/or stabilized  for discharge and I doubt any other medical condition or other [facility] requiring further screening, evaluation, or treatment in the ED at this time prior to discharge. Safe for discharge with strict return precautions.  Disposition: Discharge  Condition: Good  I have discussed the results, Dx and Tx plan with the patient/family who expressed understanding and agree(s) with the plan. Discharge instructions discussed at length. The patient/family was given strict return precautions who verbalized understanding of the instructions. No further questions at time of discharge.    ED Discharge Orders          Ordered    divalproex (DEPAKOTE) 500 MG DR tablet  2 times daily        11/15/21 0328              Follow Up: Neurology, [facility] [address] [place] [address] [phone]  Call  to schedule an appointment for close follow up  Tia Alert, MD [address] [place] [address] [phone]  Call  to schedule an appointment for close follow up cervical herniated disc     Final Clinical Impression(s) / ED Diagnoses Final diagnoses:  Seizure [facility])     This chart was dictated using voice recognition software.  Despite best efforts to proofread,  errors can occur which can change the documentation meaning.    Nira Conn, MD 11/15/21 0330

## 2021-11-15 NOTE — ED Notes (Signed)
Patient Alert and oriented to baseline. Stable and ambulatory to baseline. Patient verbalized understanding of the discharge instructions.  Patient belongings were taken by the patient.   

## 2021-11-15 NOTE — ED Notes (Signed)
Pt returned from CT via stretcher.

## 2021-11-15 NOTE — ED Notes (Addendum)
Pt A & O, able to recall life events, able to take PO meds.  Suture cart set up at bedside, pt requested suction for oral bleeding, yaunker provided.

## 2021-11-15 NOTE — ED Notes (Signed)
Pt sleeping soundly on stretcher, pt had reported he does not have transportation home, will continue to monitor until buses begin running.

## 2021-11-15 NOTE — ED Provider Notes (Addendum)
Please see Dr. Harland Dingwall note for full H&P.  Left upper lip laceration repaired by me as below.   Marland Kitchen.Laceration Repair  Date/Time: 11/15/2021 2:41 AM Performed by: Cherly Anderson, PA-C Authorized by: Cherly Anderson, PA-C   Consent:    Consent obtained:  Verbal   Consent given by:  Patient   Risks, benefits, and alternatives were discussed: yes     Risks discussed:  Infection, need for additional repair, nerve damage, poor wound healing, poor cosmetic result, pain, retained foreign body, tendon damage and vascular damage   Alternatives discussed:  No treatment Anesthesia:    Anesthesia method:  Local infiltration   Local anesthetic:  Lidocaine 2% WITH epi Laceration details:    Location:  Lip   Lip location:  Upper exterior lip   Wound length (cm): avulsion/crescent shaped laceration- 2 cm wide, 4cm total length.   Depth (mm):  4 Exploration:    Hemostasis achieved with:  Direct pressure   Contaminated: no   Treatment:    Irrigation solution:  Sterile water   Irrigation method:  Syringe Skin repair:    Repair method:  Sutures   Suture size:  5-0   Wound skin closure material used: vicryl rapide.   Suture technique:  Simple interrupted   Number of sutures:  11 Approximation:    Approximation:  Close   Vermilion border well-aligned: yes   Repair type:    Repair type:  Simple Post-procedure details:    Procedure completion:  Tolerated well, no immediate complications      Cherly Anderson, PA-C 11/15/21 0245    Cherly Anderson, PA-C 11/15/21 0246    Nira Conn, MD 11/15/21 [phone]

## 2021-12-15 ENCOUNTER — Other Ambulatory Visit: Payer: Self-pay

## 2021-12-15 ENCOUNTER — Encounter (HOSPITAL_COMMUNITY): Payer: Self-pay

## 2021-12-15 ENCOUNTER — Emergency Department (HOSPITAL_COMMUNITY)
Admission: EM | Admit: 2021-12-15 | Discharge: 2021-12-15 | Disposition: A | Discharge status: Home / Self Care | Payer: Medicaid Other | Attending: Emergency Medicine | Admitting: Emergency Medicine

## 2021-12-15 DIAGNOSIS — R569 Unspecified convulsions: Secondary | ICD-10-CM

## 2021-12-15 DIAGNOSIS — G40909 Epilepsy, unspecified, not intractable, without status epilepticus: Secondary | ICD-10-CM | POA: Insufficient documentation

## 2021-12-15 DIAGNOSIS — Z9114 Patient's other noncompliance with medication regimen: Secondary | ICD-10-CM | POA: Insufficient documentation

## 2021-12-15 LAB — CBC WITH DIFFERENTIAL/PLATELET
Abs Immature Granulocytes: 0 10*3/uL (ref 0.00–0.07)
Basophils Absolute: 0 10*3/uL (ref 0.0–0.1)
Basophils Relative: 1 %
Eosinophils Absolute: 0.1 10*3/uL (ref 0.0–0.5)
Eosinophils Relative: 5 %
HCT: 40.5 % (ref 39.0–52.0)
Hemoglobin: 12.7 g/dL — ABNORMAL LOW (ref 13.0–17.0)
Immature Granulocytes: 0 %
Lymphocytes Relative: 38 %
Lymphs Abs: 0.9 10*3/uL (ref 0.7–4.0)
MCH: 25.6 pg — ABNORMAL LOW (ref 26.0–34.0)
MCHC: 31.4 g/dL (ref 30.0–36.0)
MCV: 81.5 fL (ref 80.0–100.0)
Monocytes Absolute: 0.2 10*3/uL (ref 0.1–1.0)
Monocytes Relative: 7 %
Neutro Abs: 1.2 10*3/uL — ABNORMAL LOW (ref 1.7–7.7)
Neutrophils Relative %: 49 %
Platelets: 190 10*3/uL (ref 150–400)
RBC: 4.97 MIL/uL (ref 4.22–5.81)
RDW: 11.9 % (ref 11.5–15.5)
WBC: 2.4 10*3/uL — ABNORMAL LOW (ref 4.0–10.5)
nRBC: 0 % (ref 0.0–0.2)

## 2021-12-15 LAB — BASIC METABOLIC PANEL
Anion gap: 7 (ref 5–15)
BUN: 7 mg/dL (ref 6–20)
CO2: 27 mmol/L (ref 22–32)
Calcium: 9.3 mg/dL (ref 8.9–10.3)
Chloride: 101 mmol/L (ref 98–111)
Creatinine, Ser: 0.86 mg/dL (ref 0.61–1.24)
GFR, Estimated: >60 mL/min (ref >60)
Glucose, Bld: 90 mg/dL (ref 70–99)
Potassium: 4.1 mmol/L (ref 3.5–5.1)
Sodium: 135 mmol/L (ref 135–145)

## 2021-12-15 LAB — VALPROIC ACID LEVEL: Valproic Acid Lvl: 57 ug/mL (ref 50.0–100.0)

## 2021-12-15 MED ORDER — DIVALPROEX SODIUM 500 MG PO DR TAB: 500.0000 mg | DELAYED_RELEASE_TABLET | Freq: Two times a day (BID) | ORAL | 0 refills | Status: DC

## 2021-12-15 MED ORDER — LORAZEPAM 2 MG/ML IJ SOLN
1.0000 mg | Freq: Once | INTRAMUSCULAR | Status: AC
  Administered 2021-12-15: 14:00:00 1 mg via INTRAVENOUS
  Filled 2021-12-15: qty 1

## 2021-12-15 MED ORDER — DIVALPROEX SODIUM 250 MG PO DR TAB
500.0000 mg | DELAYED_RELEASE_TABLET | Freq: Once | ORAL | Status: AC
  Administered 2021-12-15: 14:00:00 500 mg via ORAL
  Filled 2021-12-15: qty 2

## 2021-12-15 NOTE — ED Provider Notes (Signed)
°  Physical Exam  BP 107/72    Pulse 64    Temp 98 F (36.7 C)    Resp 15    Ht 6\' 5"  (1.956 m)    Wt 83.9 kg    SpO2 96%    BMI 21.94 kg/m   Physical Exam  ED Course/Procedures     Procedures  MDM  Patient care assumed from [place] PA at shift change, please see his note for full HPI.  Briefly, patient here with seizure due to noncompliance to medication.  According to prior ADP's note patient has not taken his Depakote in 1 to 2 days, states he has not been hydrating well.  Has not had any seizures since he has been in the emergency department in approximately 4 hours.  Neurologically intact.  He has mostly been sleeping since his arrival.  In addition, patient was given Ativan while in the ED.  During my reassessment patient is alert and oriented x4, does feel somewhat fatigued but is answering questions adequately.  He is ambulatory in the ED with a steady gait, walking to the bathroom multiple times.  I discussed with patient the importance of compliance with medication.  He was given a new refill for Depakote for the next month.  He currently is on a regimen of 500 mg twice daily.  Patient in stable condition, hemodynamically stable for discharge.   Portions of this note were generated with HARJAVALTA. Dictation errors may occur despite best attempts at proofreading.        Scientist, clinical (histocompatibility and immunogenetics), PA-C 12/15/21 1658    12/17/21, MD 12/17/21 [phone]
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
## 2021-12-15 NOTE — Discharge Instructions (Signed)
You were prescribed medication in order to help prevent your seizures.  Please take 1 tablet twice a day for the next 30 days.  Please be aware this medication needs to be taken ongoing, you will need to follow-up with your neurologist to have further management of your seizure disorder.  Please also be aware you should not be driving if you suffer from seizure disorder and are noncompliant with medication.

## 2021-12-15 NOTE — ED Triage Notes (Signed)
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 Patient has seizure hx and takes depakote.

## 2022-11-11 ENCOUNTER — Encounter (HOSPITAL_COMMUNITY): Payer: Self-pay | Admitting: Oncology

## 2022-11-11 ENCOUNTER — Other Ambulatory Visit: Payer: Self-pay

## 2022-11-11 ENCOUNTER — Emergency Department (HOSPITAL_COMMUNITY)
Admission: EM | Admit: 2022-11-11 | Discharge: 2022-11-11 | Disposition: A | Discharge status: Home / Self Care | Payer: Medicaid Other | Attending: Emergency Medicine | Admitting: Emergency Medicine

## 2022-11-11 DIAGNOSIS — G40909 Epilepsy, unspecified, not intractable, without status epilepticus: Secondary | ICD-10-CM

## 2022-11-11 LAB — COMPREHENSIVE METABOLIC PANEL
ALT: 28 U/L (ref 0–44)
AST: 29 U/L (ref 15–41)
Albumin: 4.2 g/dL (ref 3.5–5.0)
Alkaline Phosphatase: 53 U/L (ref 38–126)
Anion gap: 10 (ref 5–15)
BUN: 10 mg/dL (ref 6–20)
CO2: 25 mmol/L (ref 22–32)
Calcium: 9.1 mg/dL (ref 8.9–10.3)
Chloride: 100 mmol/L (ref 98–111)
Creatinine, Ser: 0.73 mg/dL (ref 0.61–1.24)
GFR, Estimated: >60 mL/min (ref >60)
Glucose, Bld: 82 mg/dL (ref 70–99)
Potassium: 3.9 mmol/L (ref 3.5–5.1)
Sodium: 135 mmol/L (ref 135–145)
Total Bilirubin: 1 mg/dL (ref 0.3–1.2)
Total Protein: 7.8 g/dL (ref 6.5–8.1)

## 2022-11-11 LAB — CBC WITH DIFFERENTIAL/PLATELET
Abs Immature Granulocytes: 0.01 10*3/uL (ref 0.00–0.07)
Basophils Absolute: 0 10*3/uL (ref 0.0–0.1)
Basophils Relative: 1 %
Eosinophils Absolute: 0.2 10*3/uL (ref 0.0–0.5)
Eosinophils Relative: 5 %
HCT: 40.6 % (ref 39.0–52.0)
Hemoglobin: 12.6 g/dL — ABNORMAL LOW (ref 13.0–17.0)
Immature Granulocytes: 0 %
Lymphocytes Relative: 45 %
Lymphs Abs: 1.3 10*3/uL (ref 0.7–4.0)
MCH: 25.4 pg — ABNORMAL LOW (ref 26.0–34.0)
MCHC: 31 g/dL (ref 30.0–36.0)
MCV: 81.7 fL (ref 80.0–100.0)
Monocytes Absolute: 0.2 10*3/uL (ref 0.1–1.0)
Monocytes Relative: 7 %
Neutro Abs: 1.2 10*3/uL — ABNORMAL LOW (ref 1.7–7.7)
Neutrophils Relative %: 42 %
Platelets: 202 10*3/uL (ref 150–400)
RBC: 4.97 MIL/uL (ref 4.22–5.81)
RDW: 11.6 % (ref 11.5–15.5)
WBC: 2.9 10*3/uL — ABNORMAL LOW (ref 4.0–10.5)
nRBC: 0 % (ref 0.0–0.2)

## 2022-11-11 LAB — CBG MONITORING, ED: Glucose-Capillary: 84 mg/dL (ref 70–99)

## 2022-11-11 LAB — VALPROIC ACID LEVEL: Valproic Acid Lvl: 51 ug/mL (ref 50.0–100.0)

## 2022-11-11 MED ORDER — LORAZEPAM 2 MG/ML IJ SOLN
1.0000 mg | Freq: Once | INTRAMUSCULAR | Status: AC
  Administered 2022-11-11: 1 mg via INTRAVENOUS
  Filled 2022-11-11: qty 1

## 2022-11-11 NOTE — ED Notes (Signed)
Pt is not lobby or self d/c appropriate. Per pt's boss Dr. Stasia Cavalier of [employer], states he will stay in lobby until family arrives to ensure pt is not given a bus pass and sent out once treatment is complete. Phone numbers of family as well as supervisor are on the bedside table in the room.

## 2022-11-11 NOTE — Discharge Instructions (Signed)
Continue your seizure medications.  Follow-up with the neurologist to discuss whether you need to have any adjustments in your medications.  Call the office if they do not contact you before the end of next week.  Return as needed for recurrent or worsening symptoms

## 2022-11-11 NOTE — ED Triage Notes (Signed)
Pt bib [facility] s/p seizure at work. Pt has hx of seizures is on Depakote and compliant w/ medication. Pt post ictal upon EMS arrival. Pt now A&O x 4.

## 2022-11-11 NOTE — ED Provider Notes (Signed)
[facility]EMERGENCY DEPT Provider Note   CSN: [id number] Arrival date & time: 11/11/22  1528     History  Chief Complaint  Patient presents with   Seizures    Joseph [name] is a 31 y.o. male.   Seizures    Patient presented to the ED for evaluation after a seizure.  Patient states he has a history of seizures.  He has been taking his medications regularly.  He does not recall what happened today but he was told he had a seizure at work.  Patient was noted to be postictal by EMS.  Patient denies any complaints at this time.  He is not having a headache.  He denies any recent illness.  States he has been sleeping well.  Home Medications Prior to Admission medications   Medication Sig Start Date End Date Taking? Authorizing Provider  divalproex (DEPAKOTE) 500 MG DR tablet Take 1 tablet (500 mg total) by mouth 2 (two) times daily. 12/15/21 01/14/22  Claude Manges, PA-C      Allergies    Gadolinium, Mushroom extract complex, Fish allergy, Dust mite extract, Shellfish allergy, and Tomato    Review of Systems   Review of Systems  Neurological:  Positive for seizures.    Physical Exam Updated Vital Signs BP 133/68   Pulse 67   Temp 98.4 F (36.9 C) (Oral)   Resp 19   Ht 1.956 m (6\' 5" )   Wt 83.9 kg   SpO2 99%   BMI 21.94 kg/m  Physical Exam Vitals and nursing note reviewed.  Constitutional:      General: He is not in acute distress.    Appearance: He is well-developed.  HENT:     Head: Normocephalic and atraumatic.     Right Ear: External ear normal.     Left Ear: External ear normal.  Eyes:     General: No scleral icterus.       Right eye: No discharge.        Left eye: No discharge.     Conjunctiva/sclera: Conjunctivae normal.  Neck:     Trachea: No tracheal deviation.  Cardiovascular:     Rate and Rhythm: Normal rate and regular rhythm.  Pulmonary:     Effort: Pulmonary effort is normal. No respiratory distress.     Breath sounds:  Normal breath sounds. No stridor. No wheezing or rales.  Abdominal:     General: Bowel sounds are normal. There is no distension.     Palpations: Abdomen is soft.     Tenderness: There is no abdominal tenderness. There is no guarding or rebound.  Musculoskeletal:        General: No tenderness or deformity.     Cervical back: Neck supple.  Skin:    General: Skin is warm and dry.     Findings: No rash.  Neurological:     General: No focal deficit present.     Mental Status: He is alert.     Cranial Nerves: No cranial nerve deficit (no facial droop, extraocular movements intact, no slurred speech).     Sensory: No sensory deficit.     Motor: No abnormal muscle tone or seizure activity.     Coordination: Coordination normal.  Psychiatric:        Mood and Affect: Mood normal.     ED Results / Procedures / Treatments   Labs (all labs ordered are listed, but only abnormal results are displayed) Labs Reviewed  CBC WITH DIFFERENTIAL/PLATELET -  Abnormal; Notable for the following components:      Result Value   WBC 2.9 (*)    Hemoglobin 12.6 (*)    MCH 25.4 (*)    Neutro Abs 1.2 (*)    All other components within normal limits  COMPREHENSIVE METABOLIC PANEL  VALPROIC ACID LEVEL  CBG MONITORING, ED    EKG None  Radiology No results found.  Procedures Procedures    Medications Ordered in ED Medications  LORazepam (ATIVAN) injection 1 mg (1 mg Intravenous Given 11/11/22 1610)    ED Course/ Medical Decision Making/ A&P Clinical Course as of 11/11/22 1915  Fri Nov 11, 2022  1715 Valproic acid level Therapeutic level [JK]  1715 Comprehensive metabolic panel Normal [JK]    Clinical Course User Index [JK] Linwood Dibbles, MD                           Medical Decision Making Problems Addressed: Seizure disorder [facility]): acute illness or injury that poses a threat to life or bodily functions  Amount and/or Complexity of Data Reviewed Labs: ordered. Decision-making details  documented in ED Course.  Risk Prescription drug management.   Patient presented to the ED for evaluation after a seizure.  Patient does have history of seizure disorder.  No significant laboratory abnormalities.  Patient's white count noted to be decreased but this is similar compared to previous values.  Patient's valproic acid level is therapeutic.  He has not had any recurrent seizures after monitoring in the ED.  He is alert and back to his baseline.  Will refer to neurology as an outpatient to see if he needs any adjustment in his medications.  Warning signs precautions discussed.        Final Clinical Impression(s) / ED Diagnoses Final diagnoses:  Seizure disorder (HCC)    Rx / DC Orders ED Discharge Orders          Ordered    Ambulatory referral to Neurology       Comments: An appointment is requested in approximately: 2 weeks   11/11/22 1914              Linwood Dibbles, MD 11/11/22 [phone]

## 2022-11-11 NOTE — ED Notes (Signed)
D/t ativan administration pt is resting quietly. Respirations even and unlabored. Will defer neuro checks at this time as pt is unable to participate.

## 2022-12-07 ENCOUNTER — Other Ambulatory Visit: Payer: Self-pay

## 2022-12-07 ENCOUNTER — Encounter (HOSPITAL_COMMUNITY): Payer: Self-pay

## 2022-12-07 ENCOUNTER — Emergency Department (HOSPITAL_COMMUNITY)
Admission: EM | Admit: 2022-12-07 | Discharge: 2022-12-07 | Disposition: A | Discharge status: Home / Self Care | Payer: Self-pay | Attending: Emergency Medicine | Admitting: Emergency Medicine

## 2022-12-07 DIAGNOSIS — R300 Dysuria: Secondary | ICD-10-CM | POA: Insufficient documentation

## 2022-12-07 LAB — BASIC METABOLIC PANEL
Anion gap: 9 (ref 5–15)
BUN: 9 mg/dL (ref 6–20)
CO2: 29 mmol/L (ref 22–32)
Calcium: 9.3 mg/dL (ref 8.9–10.3)
Chloride: 102 mmol/L (ref 98–111)
Creatinine, Ser: 0.8 mg/dL (ref 0.61–1.24)
GFR, Estimated: >60 mL/min (ref >60)
Glucose, Bld: 85 mg/dL (ref 70–99)
Potassium: 4.3 mmol/L (ref 3.5–5.1)
Sodium: 140 mmol/L (ref 135–145)

## 2022-12-07 LAB — URINALYSIS, ROUTINE W REFLEX MICROSCOPIC
Bilirubin Urine: NEGATIVE
Glucose, UA: NEGATIVE mg/dL
Hgb urine dipstick: NEGATIVE
Ketones, ur: NEGATIVE mg/dL
Leukocytes,Ua: NEGATIVE
Nitrite: NEGATIVE
Protein, ur: NEGATIVE mg/dL
Specific Gravity, Urine: 1.02 (ref 1.005–1.030)
pH: 5 (ref 5.0–8.0)

## 2022-12-07 LAB — CBC WITH DIFFERENTIAL/PLATELET
Abs Immature Granulocytes: 0 10*3/uL (ref 0.00–0.07)
Basophils Absolute: 0 10*3/uL (ref 0.0–0.1)
Basophils Relative: 1 %
Eosinophils Absolute: 0.2 10*3/uL (ref 0.0–0.5)
Eosinophils Relative: 6 %
HCT: 40 % (ref 39.0–52.0)
Hemoglobin: 12.5 g/dL — ABNORMAL LOW (ref 13.0–17.0)
Immature Granulocytes: 0 %
Lymphocytes Relative: 54 %
Lymphs Abs: 2.1 10*3/uL (ref 0.7–4.0)
MCH: 25.2 pg — ABNORMAL LOW (ref 26.0–34.0)
MCHC: 31.3 g/dL (ref 30.0–36.0)
MCV: 80.5 fL (ref 80.0–100.0)
Monocytes Absolute: 0.3 10*3/uL (ref 0.1–1.0)
Monocytes Relative: 8 %
Neutro Abs: 1.2 10*3/uL — ABNORMAL LOW (ref 1.7–7.7)
Neutrophils Relative %: 31 %
Platelets: 221 10*3/uL (ref 150–400)
RBC: 4.97 MIL/uL (ref 4.22–5.81)
RDW: 11.9 % (ref 11.5–15.5)
WBC: 3.9 10*3/uL — ABNORMAL LOW (ref 4.0–10.5)
nRBC: 0 % (ref 0.0–0.2)

## 2022-12-07 LAB — WET PREP, GENITAL
Clue Cells Wet Prep HPF POC: NONE SEEN
Sperm: NONE SEEN
Trich, Wet Prep: NONE SEEN
WBC, Wet Prep HPF POC: <10 (ref <10)
Yeast Wet Prep HPF POC: NONE SEEN

## 2022-12-07 NOTE — ED Notes (Signed)
No answer for triage.

## 2022-12-07 NOTE — ED Notes (Signed)
Repeat urine and swab sent to the lab at this time by this RN

## 2022-12-07 NOTE — ED Notes (Signed)
Patient transported to MRI 

## 2022-12-07 NOTE — ED Notes (Signed)
Wet prep sent at this time

## 2022-12-07 NOTE — ED Triage Notes (Signed)
Pt reports feeling cold and hot at same time. States he has been researching online and worried about a parasite.

## 2022-12-07 NOTE — ED Notes (Signed)
Reya from the lab reports that the gc/c urine on the patient has been sent to cytology and it will be run tomorrow. Walter team lead in lab reports understanding of the patient situation via phone call

## 2022-12-07 NOTE — ED Notes (Signed)
Patient ambulates to the restroom at this time with a steady gait 

## 2022-12-07 NOTE — ED Provider Notes (Signed)
[facility] EMERGENCY DEPARTMENT Provider Note   CSN: [id number] Arrival date & time: 12/07/22  1113     History  Chief Complaint  Patient presents with   Chills    Joseph [name] is a 31 y.o. male with no significant past medical history presenting to the emergency room for evaluation for STI.  Patient states in the last 5 months he has been having itching and burning sensation with urination.  He also reports abdominal pain.  Patient is sexually active.  He he is concerned that he has STI that needs to be checked out.  Reports chills at home.  No fever, nausea, vomiting, bowel change, rash.  HPI    Past Medical History:  Diagnosis Date   Seizures (HCC)    Past Surgical History:  Procedure Laterality Date   BRAIN SURGERY     surgery to find the cause of his seizures   FINGER SURGERY       Home Medications Prior to Admission medications   Medication Sig Start Date End Date Taking? Authorizing Provider  divalproex (DEPAKOTE) 500 MG DR tablet Take 1 tablet (500 mg total) by mouth 2 (two) times daily. 12/15/21 01/14/22  Claude Manges, PA-C      Allergies    Gadolinium, Mushroom extract complex, Fish allergy, Dust mite extract, Shellfish allergy, and Tomato    Review of Systems   Review of Systems  Genitourinary:  Positive for dysuria.    Physical Exam Updated Vital Signs BP 124/70   Pulse 66   Temp 98.3 F (36.8 C)   Resp 18   SpO2 99%  Physical Exam Vitals and nursing note reviewed.  Constitutional:      Appearance: Normal appearance.  HENT:     Head: Normocephalic and atraumatic.     Mouth/Throat:     Mouth: Mucous membranes are moist.  Eyes:     General: No scleral icterus. Cardiovascular:     Rate and Rhythm: Normal rate and regular rhythm.     Pulses: Normal pulses.     Heart sounds: Normal heart sounds.  Pulmonary:     Effort: Pulmonary effort is normal.     Breath sounds: Normal breath sounds.  Abdominal:     General: Abdomen  is flat.     Palpations: Abdomen is soft.     Tenderness: There is no abdominal tenderness.  Musculoskeletal:        General: No deformity.  Skin:    General: Skin is warm.     Findings: No rash.  Neurological:     General: No focal deficit present.     Mental Status: He is alert.  Psychiatric:        Mood and Affect: Mood normal.     ED Results / Procedures / Treatments   Labs (all labs ordered are listed, but only abnormal results are displayed) Labs Reviewed  CBC WITH DIFFERENTIAL/PLATELET - Abnormal; Notable for the following components:      Result Value   WBC 3.9 (*)    Hemoglobin 12.5 (*)    MCH 25.2 (*)    Neutro Abs 1.2 (*)    All other components within normal limits  WET PREP, GENITAL  URINALYSIS, ROUTINE W REFLEX MICROSCOPIC  BASIC METABOLIC PANEL  GC/CHLAMYDIA PROBE AMP ([place]) NOT AT [facility]  GC/CHLAMYDIA PROBE AMP ([place]) NOT AT [facility]    EKG None  Radiology No results found.  Procedures Procedures    Medications Ordered in ED Medications -  No data to display  ED Course/ Medical Decision Making/ A&P                           Medical Decision Making Amount and/or Complexity of Data Reviewed Labs: ordered.   This patient presents to the ED for STI and UTI concerns, this involves an extensive number of treatment options, and is a complaint that carries with a high risk of complications and morbidity.  The differential diagnosis includes STI, UTI, infectious etiology.  This is not an exhaustive list.  Comorbidities that complicate the patient evaluation See HPI  Social determinants of health NA  Additional history obtained: External records from outside source obtained and reviewed including: Chart review including previous notes, labs, imaging.  Cardiac monitoring/EKG: The patient was maintained on a cardiac monitor.  I personally reviewed and interpreted the cardiac monitor which showed an underlying rhythm of: Sinus  rhythm.  Lab tests: I ordered and personally interpreted labs.  The pertinent results include: WBC unremarkable. Hbg unremarkable. Platelets unremarkable. No electrolyte abnormalities noted. BUN, creatinine unremarkable. Wet prep negative.  UA negative.  Pending GC chlamydia  Imaging studies:  Problem list/ ED course/ Critical interventions/ Medical management: HPI: See above Vital signs within normal range and stable throughout visit. Laboratory/imaging studies significant for: See above. On physical examination, patient is afebrile and appears in no acute distress. Physical exam unremarkable.  Patient is concerned that he has some type of parasites, STI, UTI after looking up on [employer].  I ordered a UA, GC chlamydia, wet prep.  He reports dysuria in the last 3 to 4 months. UA negative. Wet prep negative. Based on patient's clinical presentations and laboratory/imaging studies I suspect musculoskeletal pain vs UTI. Advised patient to check MyChart for pending GC/Chlamydia results and seek treatment if they came back positive.  Take Tylenol/ibuprofen for pain.  Follow-up with primary care physician for further evaluation and management.  Return to the ER if new or worsening symptoms. I have reviewed the patient home medicines and have made adjustments as needed.  Consultations obtained:  Disposition Continued outpatient therapy. Follow-up with PCP recommended for reevaluation of symptoms. Treatment plan discussed with patient.  Pt acknowledged understanding was agreeable to the plan. Worrisome signs and symptoms were discussed with patient, and patient acknowledged understanding to return to the ED if they noticed these signs and symptoms. Patient was stable upon discharge.   This chart was dictated using voice recognition software.  Despite best efforts to proofread,  errors can occur which can change the documentation meaning.          Final Clinical Impression(s) / ED  Diagnoses Final diagnoses:  Dysuria    Rx / DC Orders ED Discharge Orders     None         Jeanelle Malling, [place] 12/08/22 1028    Gloris Manchester, MD 12/13/22 [phone]

## 2022-12-07 NOTE — ED Provider Triage Note (Signed)
Emergency Medicine Provider Triage Evaluation Note  JANATHAN BRIBIESCA , a 31 y.o. male  was evaluated in triage.  Pt complains of reports feeling "hot and cold at the same time" as well as some irritation around his genitalia.  Patient states he has been researching online and is worried he has schistosomiasis.  Denies fever, nausea, vomiting, diarrhea, abdominal pain.  Review of Systems  Positive: See above Negative: See above  Physical Exam  BP 122/66   Pulse (!) 53   Temp 97.7 F (36.5 C)   Resp 16   SpO2 97%  Gen:   Awake, no distress   Resp:  Normal effort  MSK:   Moves extremities without difficulty  Other:  Patient is scratching at his skin, especially to his face  Medical Decision Making  Medically screening exam initiated at 12:41 PM.  Appropriate orders placed.  LOUDEN HOUSEWORTH was informed that the remainder of the evaluation will be completed by another provider, this initial triage assessment does not replace that evaluation, and the importance of remaining in the ED until their evaluation is complete.     Melton Alar R, [place] 12/07/22 1243

## 2022-12-07 NOTE — ED Notes (Signed)
Provider notified of this RN's conversation with lab regarding urine and wet prep. Per lab they never received the wet prep and cannot see the gc/chlamydia order

## 2022-12-07 NOTE — Discharge Instructions (Addendum)
Take tylenol/ibuprofen for pain. I recommend close follow-up with PCP for reevaluation.  Please do not hesitate to return to emergency department if worrisome signs symptoms we discussed become apparent.

## 2022-12-08 LAB — GC/CHLAMYDIA PROBE AMP (~~LOC~~) NOT AT ARMC
Chlamydia: NEGATIVE
Chlamydia: NEGATIVE
Comment: NEGATIVE
Comment: NEGATIVE
Comment: NORMAL
Comment: NORMAL
Neisseria Gonorrhea: NEGATIVE
Neisseria Gonorrhea: NEGATIVE

## 2022-12-19 ENCOUNTER — Other Ambulatory Visit: Payer: Self-pay

## 2022-12-19 ENCOUNTER — Emergency Department (HOSPITAL_COMMUNITY)
Admission: EM | Admit: 2022-12-19 | Discharge: 2022-12-19 | Disposition: A | Discharge status: Home / Self Care | Payer: Medicaid Other | Attending: Emergency Medicine | Admitting: Emergency Medicine

## 2022-12-19 DIAGNOSIS — K0889 Other specified disorders of teeth and supporting structures: Secondary | ICD-10-CM | POA: Insufficient documentation

## 2022-12-19 MED ORDER — AMOXICILLIN-POT CLAVULANATE 875-125 MG PO TABS: 1.0000 | ORAL_TABLET | Freq: Two times a day (BID) | ORAL | 0 refills | Status: DC

## 2022-12-19 MED ORDER — NAPROXEN 500 MG PO TABS: 500.0000 mg | ORAL_TABLET | Freq: Two times a day (BID) | ORAL | 0 refills | Status: DC

## 2022-12-19 MED ORDER — IBUPROFEN 400 MG PO TABS
600.0000 mg | ORAL_TABLET | Freq: Once | ORAL | Status: AC
  Administered 2022-12-19: 600 mg via ORAL
  Filled 2022-12-19: qty 1

## 2022-12-19 MED ORDER — ACETAMINOPHEN 325 MG PO TABS
650.0000 mg | ORAL_TABLET | Freq: Once | ORAL | Status: AC
  Administered 2022-12-19: 650 mg via ORAL
  Filled 2022-12-19: qty 2

## 2022-12-19 NOTE — ED Provider Notes (Signed)
[facility] EMERGENCY DEPARTMENT Provider Note   CSN: [id number] Arrival date & time: 12/19/22  0630     History  Chief Complaint  Patient presents with   Dental Pain    Joseph [name] is a 31 y.o. male with history of homelessness and seizures presenting to the ED due to dental pain.  States pain started in the back right lower molars.  Now has pain of both right and left lower molars.  Describes it as mild and throbbing.  Has not seen a dentist in 7 years.  Denies facial swelling, fevers, chills, difficulty swallowing, tongue swelling, or neck swelling.  No recent injury.  Has tried BC/Goody powder with some relief.  Requesting something for pain.  States he cannot get into the use his dental insurance until the first of the year, requesting dental resources.  The history is provided by the patient and medical records.  Dental Pain    Home Medications Prior to Admission medications   Medication Sig Start Date End Date Taking? Authorizing Provider  amoxicillin-clavulanate (AUGMENTIN) 875-125 MG tablet Take 1 tablet by mouth every 12 (twelve) hours. 12/19/22  Yes Cecil Cobbs, PA-C  naproxen (NAPROSYN) 500 MG tablet Take 1 tablet (500 mg total) by mouth 2 (two) times daily. 12/19/22  Yes Cecil Cobbs, PA-C  divalproex (DEPAKOTE) 500 MG DR tablet Take 1 tablet (500 mg total) by mouth 2 (two) times daily. 12/15/21 01/14/22  Claude Manges, PA-C      Allergies    Gadolinium, Mushroom extract complex, Fish allergy, Dust mite extract, Shellfish allergy, and Tomato    Review of Systems   Review of Systems  HENT:  Positive for dental problem.     Physical Exam Updated Vital Signs BP 135/80 (BP Location: Right Arm)   Pulse 62   Temp 98.2 F (36.8 C) (Oral)   Resp 14   SpO2 99%  Physical Exam Vitals and nursing note reviewed.  Constitutional:      General: He is not in acute distress.    Appearance: He is well-developed. He is not ill-appearing,  toxic-appearing or diaphoretic.  HENT:     Head: Normocephalic and atraumatic.     Nose: Nose normal.     Mouth/Throat:     Mouth: Mucous membranes are moist.     Pharynx: Oropharynx is clear. No oropharyngeal exudate or posterior oropharyngeal erythema.     Comments: Airway patent.  Tongue without elevation, swelling, or deviation.  Uvula midline without deviation.  Poor dentition overall.  Without obvious fracture.  No dental abscess appreciated.  Mild to minimal gingivitis, without bleeding or exquisite tenderness.  No parotid tenderness.  Able to open and close without difficulty.  No TMJ tenderness. Eyes:     Conjunctiva/sclera: Conjunctivae normal.  Neck:     Comments: Very supple on exam, no meningismus or torticollis.  Without neck swelling or tenderness.  Able to swallow without difficulty, handling salivary secretions well. Cardiovascular:     Rate and Rhythm: Normal rate and regular rhythm.     Heart sounds: No murmur heard.    Comments: No murmurs rubs or gallops Pulmonary:     Effort: Pulmonary effort is normal. No respiratory distress.     Breath sounds: Normal breath sounds.  Abdominal:     Palpations: Abdomen is soft.     Tenderness: There is no abdominal tenderness.  Musculoskeletal:        General: No swelling.     Cervical back: Neck  supple. No rigidity.  Skin:    General: Skin is warm and dry.     Capillary Refill: Capillary refill takes less than 2 seconds.  Neurological:     Mental Status: He is alert.     Comments: GCS 15  Psychiatric:        Mood and Affect: Mood normal.     ED Results / Procedures / Treatments   Labs (all labs ordered are listed, but only abnormal results are displayed) Labs Reviewed - No data to display  EKG None  Radiology No results found.  Procedures Procedures    Medications Ordered in ED Medications  acetaminophen (TYLENOL) tablet 650 mg (650 mg Oral Given 12/19/22 1022)  ibuprofen (ADVIL) tablet 600 mg (600 mg Oral  Given 12/19/22 1023)    ED Course/ Medical Decision Making/ A&P                           Medical Decision Making Risk OTC drugs. Prescription drug management.   31 y.o. male presents to the ED for concern of Dental Pain   This involves an extensive number of treatment options, and is a complaint that carries with it a high risk of complications and morbidity.  The emergent differential diagnosis prior to evaluation includes, but is not limited to: Dental abscess, dental caries, Ludwig's angina  This is not an exhaustive differential.   Past Medical History / Co-morbidities / Social History: Hx of homelessness and seizures Social Determinants of Health include: Physicist, medical strain, list of dentists provided, paper prescriptions provided, may bring to pharmacy of choice  Additional History:  Obtained by chart review.  Notably recent ED visits, see for details  Lab Tests: None  Imaging Studies: None  ED Course: Pt well-appearing on exam.  Patient presenting with toothache.  Nonseptic, non-ill appearing, in NAD.  Afebrile.  Poor dentition overall.  No visible purulent discharge or gross abscess.  Tongue not swollen or elevated.  Neck without swelling or significant tenderness, is very supple on exam.  Full TMJ ROM.  Mild tenderness of affected area.  Exam unconcerning for Ludwig's angina or spread of infection.  Airway intact.  Pain managed in ED.  Plan to treat with Augmentin and anti-inflammatories.  Urged patient to follow-up closely with dentist.  Without dentist, resources provided.  Patient in NAD and in good condition at time of discharge.  Disposition: After consideration the patient's encounter today, I do not feel today's workup suggests an emergent condition requiring admission or immediate intervention beyond what has been performed at this time.  Safe for discharge; instructed to return immediately for worsening symptoms, change in symptoms or any other concerns.  I  have reviewed the patients home medicines and have made adjustments as needed.  Discussed course of treatment with the patient, whom demonstrated understanding.  Patient in agreement and has no further questions.    This chart was dictated using voice recognition software.  Despite best efforts to proofread, errors can occur which can change the documentation meaning.         Final Clinical Impression(s) / ED Diagnoses Final diagnoses:  Pain, dental    Rx / DC Orders ED Discharge Orders          Ordered    amoxicillin-clavulanate (AUGMENTIN) 875-125 MG tablet  Every 12 hours        12/19/22 1006    naproxen (NAPROSYN) 500 MG tablet  2 times daily  12/19/22 1006              Cecil Cobbs, [place] 12/20/22 [id number]    Vanetta Mulders, MD 12/31/22 1401

## 2022-12-19 NOTE — Discharge Instructions (Addendum)
You were evaluated in the emergency department today due to dental pain.  You been provided the resource guide for local dentist that assist patients with financial/resource strains.  Please call first thing tomorrow morning to schedule a follow-up appointment for evaluation and further treatment.  You have also been provided the prescriptions for an antibiotic by the name of Augmentin and an anti-inflammatory by the name of naproxen.  Please take as directed.  Return to the ED for new or worsening symptoms as discussed.

## 2022-12-19 NOTE — ED Triage Notes (Signed)
Pt is here with pain to upper and lower molars on both sides of mouth for the past 3 days.

## 2023-03-28 IMAGING — CT CT HEAD W/O CM
3 of 4 series · 13 of 47 positions shown, 15 images · non-contrast
Comparison: Head CT of 08/06/2020. No prior cross-sectional imaging
of the spine.
COMPARISON: Head CT of 08/06/2020. No prior cross-sectional imaging
of the spine.

Addendum:
CLINICAL DATA: Seizure with head and neck trauma.

EXAM:
CT HEAD WITHOUT CONTRAST
CT CERVICAL SPINE WITHOUT CONTRAST
TECHNIQUE: Multidetector CT imaging of the head and cervical spine was
performed following the standard protocol without intravenous
contrast. Multiplanar CT image reconstructions of the cervical spine
were also generated.

[Series 3: head without · axial · non-contrast · 0.44mm/px · z∈[-76,+44]mm · 7 of 34 slices shown, 9 images]
[im 5/34  brain]
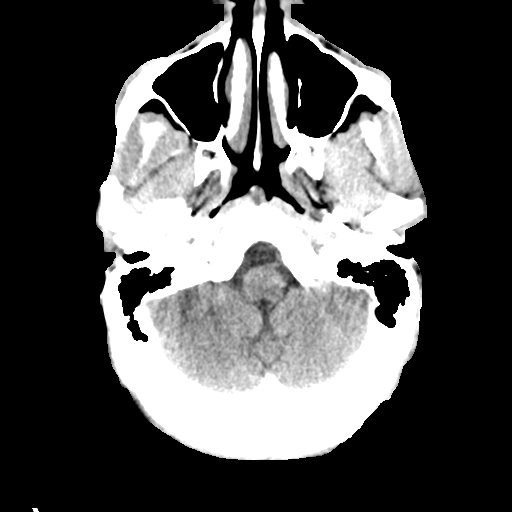
[im 5/34  bone]
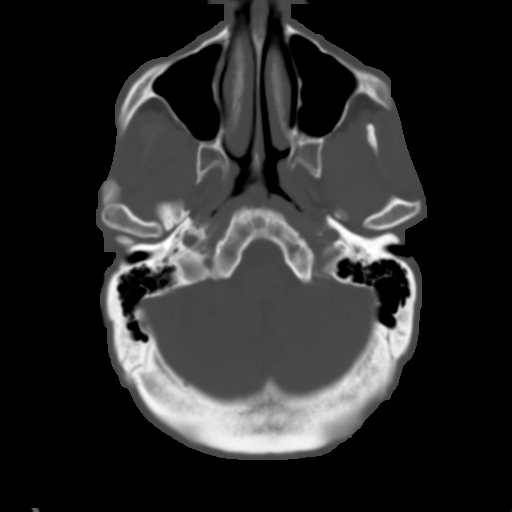
[im 9/34  brain]
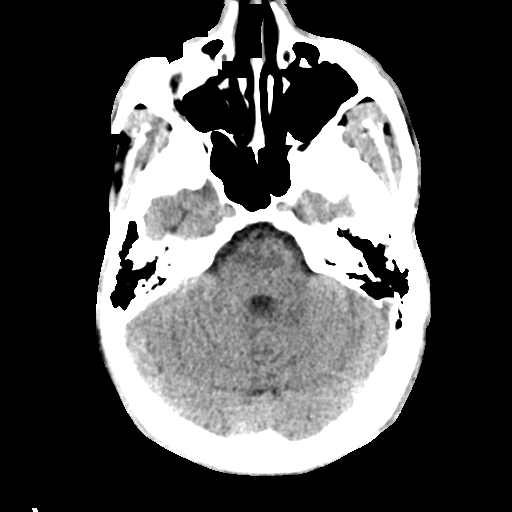
[im 13/34  brain]
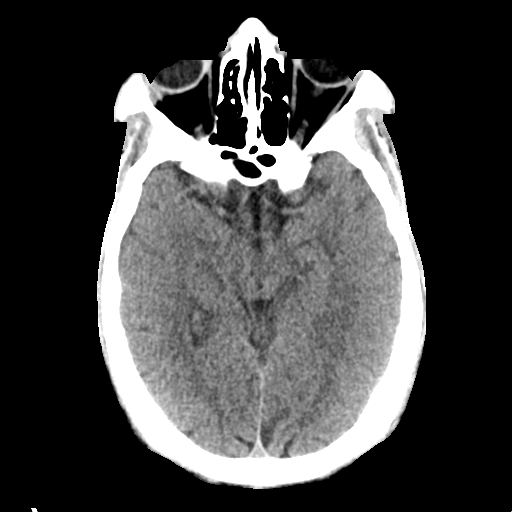
[im 17/34  brain]
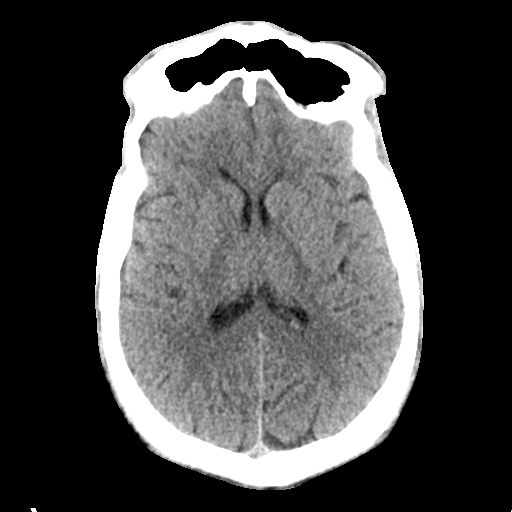
[im 21/34  brain]
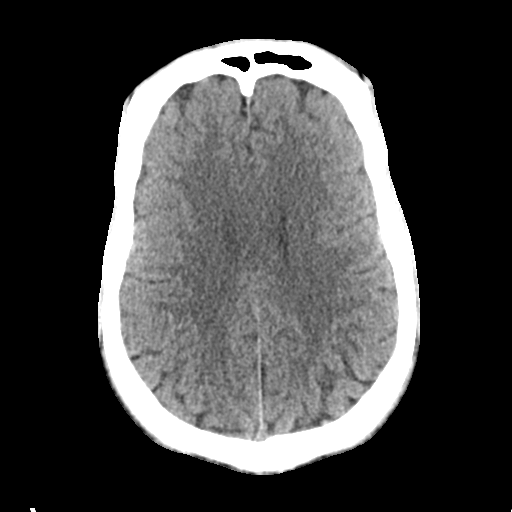
[im 21/34  bone]
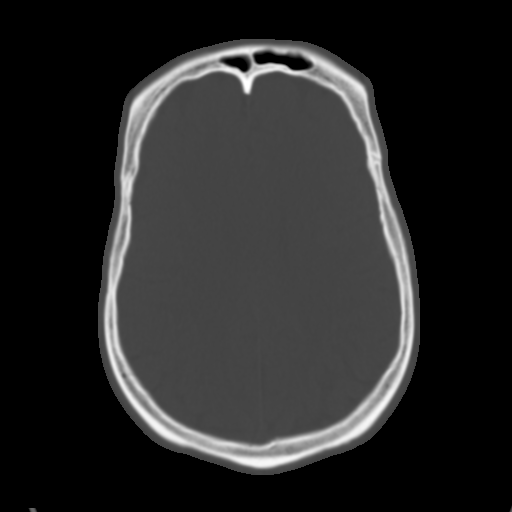
[im 25/34  brain]
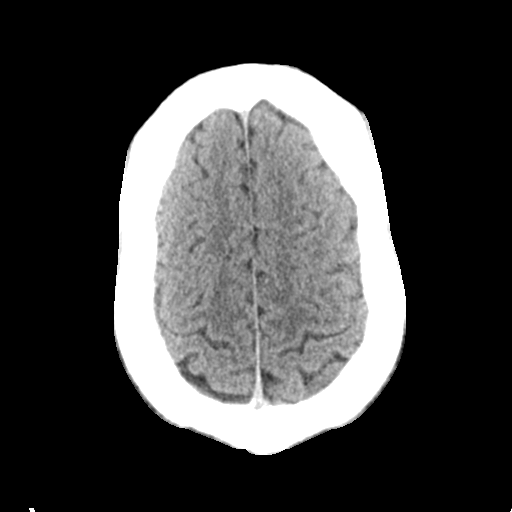
[im 29/34  brain]
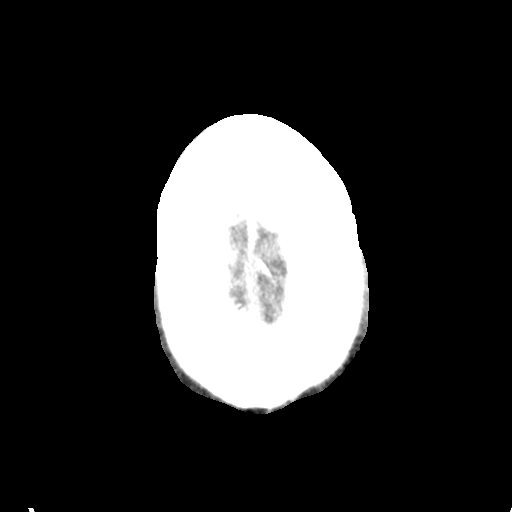

[Series 5: head without cor · coronal · non-contrast · 0.33mm/px · 3 of 76 slices shown]
[im 26/76  brain]
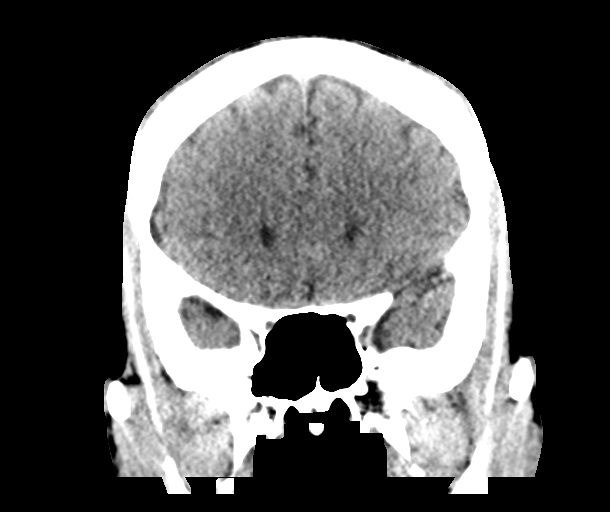
[im 34/76  brain]
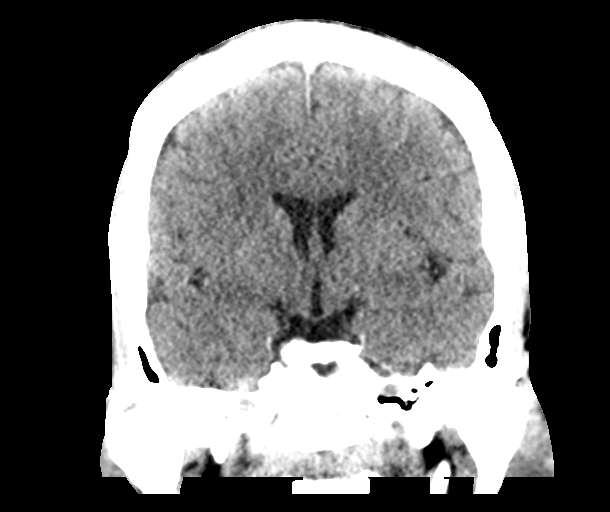
[im 42/76  brain]
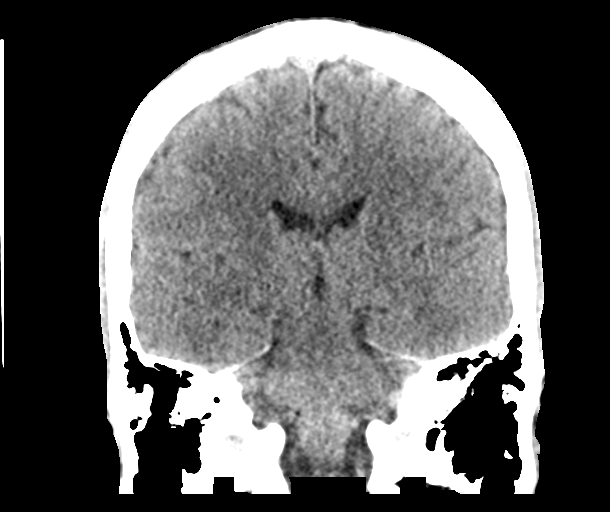

[Series 6: head without sag · sagittal · non-contrast · 0.33mm/px · 3 of 67 slices shown]
[im 23/67  brain]
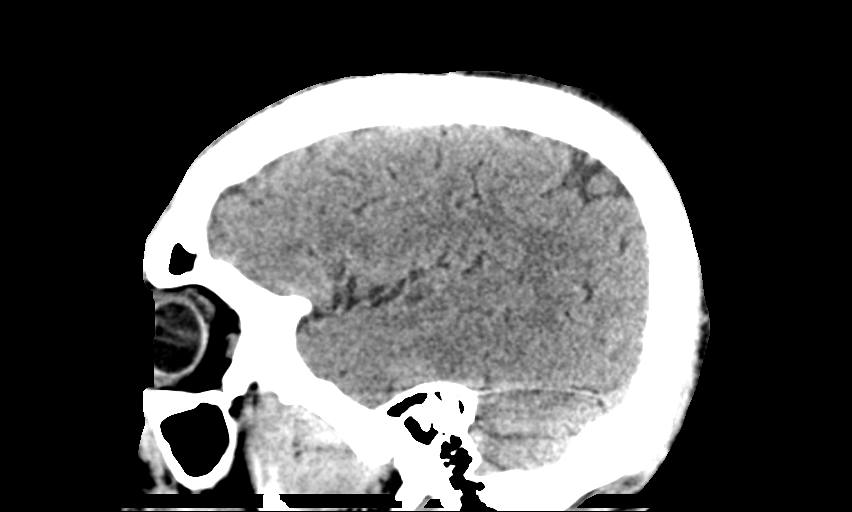
[im 34/67  brain]
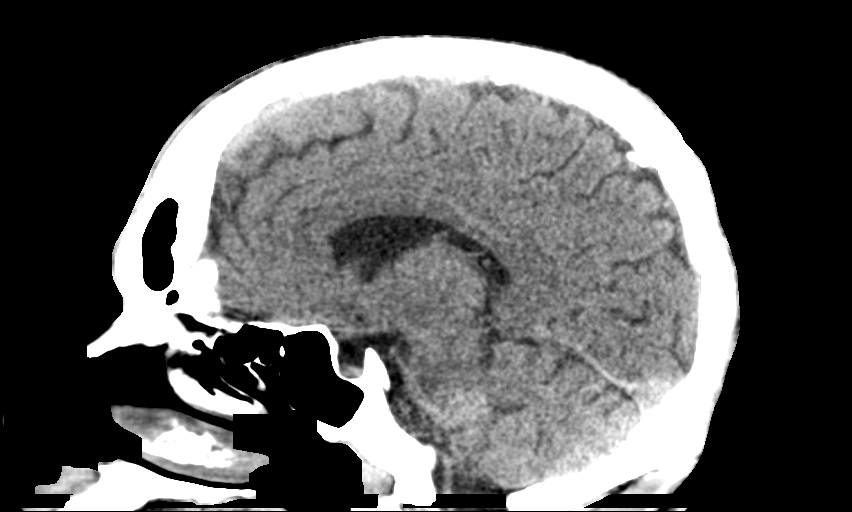
[im 45/67  brain]
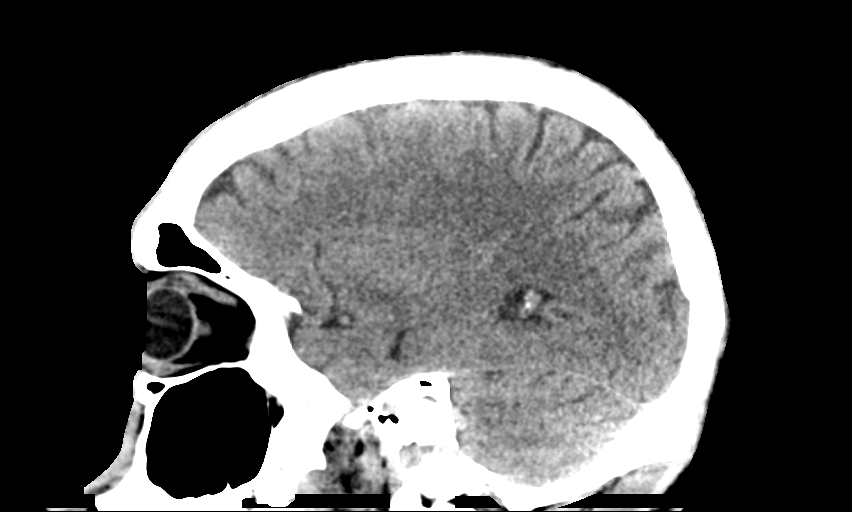

[13 of 47 positions shown; findings below may reference images not displayed]

FINDINGS: CT HEAD FINDINGS

Brain: No evidence of acute infarction, hemorrhage, hydrocephalus,
extra-axial collection or mass lesion/mass effect.

Vascular: No hyperdense vessel or unexpected calcification.

Skull: Normal. Negative for fracture or focal lesion.

Sinuses/Orbits: No acute finding.

Other: None.

CT CERVICAL SPINE FINDINGS

Alignment: There is a slight cervical dextroscoliosis. There is no
AP listhesis or other abnormal alignment. C1 is normally positioned
on C2.

Skull base and vertebrae: There is normal bone mineralization with
no evidence of fractures, with preservation of the normal vertebral
and disc heights. There is early narrowing and spurring of the
anterior atlantodental joint. No focal bone lesion is seen.

Soft tissues and spinal canal: No prevertebral fluid or swelling. No
visible canal hematoma.

Disc levels: All discs are normal in heights. There is a shallow
posterior disc protrusion mildly effacing the ventral CSF at C3-4
without mass effect. There is a slight disc bulge at C4-5. Other
levels do not show significant soft tissue or bony encroachment on
the thecal sac. There are trace facet spurs at C3-4, C4-5 and C5-6
but no levels show significant foraminal encroachment.

Upper chest: Negative.

Other:  none.
IMPRESSION: 1. No acute intracranial CT findings or depressed skull fracture.
2. Slight cervical dextroscoliosis without evidence of fractures or
listhesis. Trace facet spurring without foraminal stenosis.
3. Shallow posterior disc protrusion C3-4 without mass effect on the
cord.
4. Slight disc bulge C4-5.

ADDENDUM:
Comparison is made to cervical spine CT of 07/22/2019. The shallow
disc protrusion described above at C3-C4 was not seen at that time.
In all other respects no further changes are noted.

*** End of Addendum ***
FINDINGS: CT HEAD FINDINGS

Brain: No evidence of acute infarction, hemorrhage, hydrocephalus,
extra-axial collection or mass lesion/mass effect.

Vascular: No hyperdense vessel or unexpected calcification.

Skull: Normal. Negative for fracture or focal lesion.

Sinuses/Orbits: No acute finding.

Other: None.

CT CERVICAL SPINE FINDINGS

Alignment: There is a slight cervical dextroscoliosis. There is no
AP listhesis or other abnormal alignment. C1 is normally positioned
on C2.

Skull base and vertebrae: There is normal bone mineralization with
no evidence of fractures, with preservation of the normal vertebral
and disc heights. There is early narrowing and spurring of the
anterior atlantodental joint. No focal bone lesion is seen.

Soft tissues and spinal canal: No prevertebral fluid or swelling. No
visible canal hematoma.

Disc levels: All discs are normal in heights. There is a shallow
posterior disc protrusion mildly effacing the ventral CSF at C3-4
without mass effect. There is a slight disc bulge at C4-5. Other
levels do not show significant soft tissue or bony encroachment on
the thecal sac. There are trace facet spurs at C3-4, C4-5 and C5-6
but no levels show significant foraminal encroachment.

Upper chest: Negative.

Other:  none.
IMPRESSION: 1. No acute intracranial CT findings or depressed skull fracture.
2. Slight cervical dextroscoliosis without evidence of fractures or
listhesis. Trace facet spurring without foraminal stenosis.
3. Shallow posterior disc protrusion C3-4 without mass effect on the
cord.
4. Slight disc bulge C4-5.

## 2023-03-28 IMAGING — CT CT CERVICAL SPINE W/O CM
3 of 4 series · 10 of 33 positions shown, 12 images · non-contrast
Comparison: Head CT of 08/06/2020. No prior cross-sectional imaging
of the spine.
COMPARISON: Head CT of 08/06/2020. No prior cross-sectional imaging
of the spine.

Addendum:
CLINICAL DATA: Seizure with head and neck trauma.

EXAM:
CT HEAD WITHOUT CONTRAST
CT CERVICAL SPINE WITHOUT CONTRAST
TECHNIQUE: Multidetector CT imaging of the head and cervical spine was
performed following the standard protocol without intravenous
contrast. Multiplanar CT image reconstructions of the cervical spine
were also generated.

[Series 4: c_spine 2.0 orthogonals · oblique · 0.21mm/px · 2 of 101 slices shown, 3 images]
[im 34/101  soft-tissue]
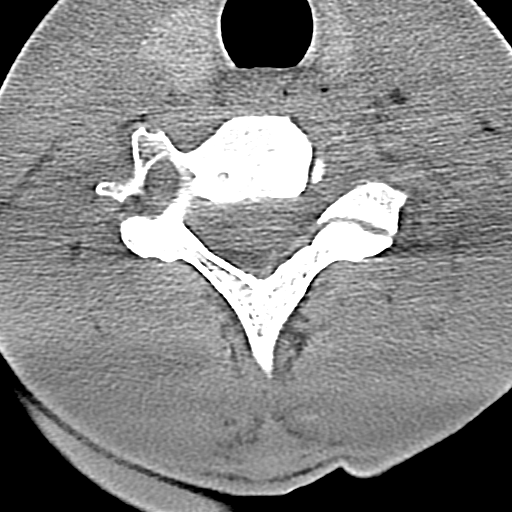
[im 34/101  bone]
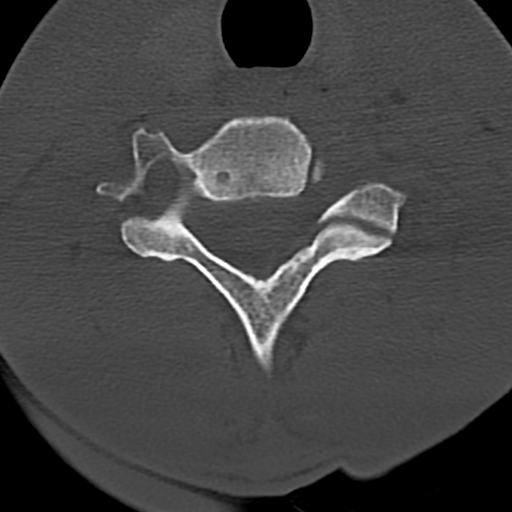
[im 67/101  bone]
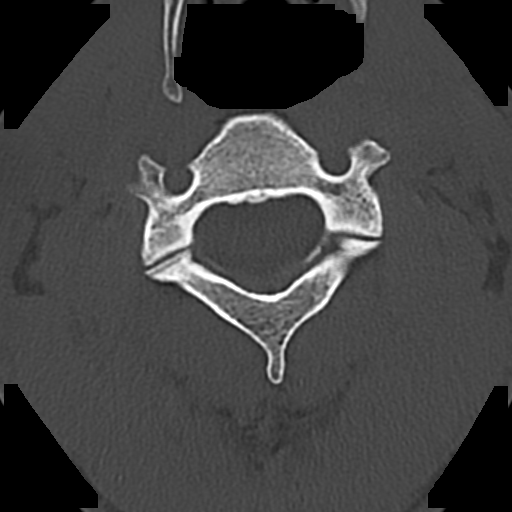

[Series 9: c_spine 2.0 sag bone · sagittal · 0.29mm/px · 5 of 61 slices shown, 6 images]
[im 21/61  bone]
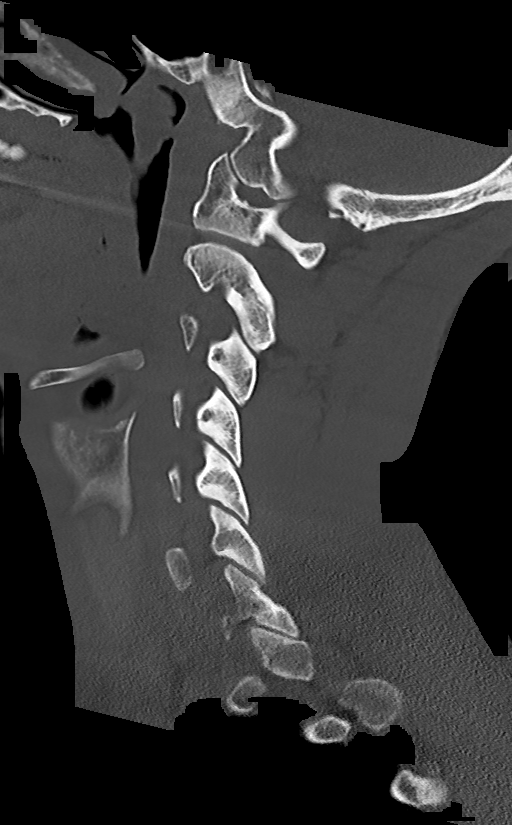
[im 26/61  bone]
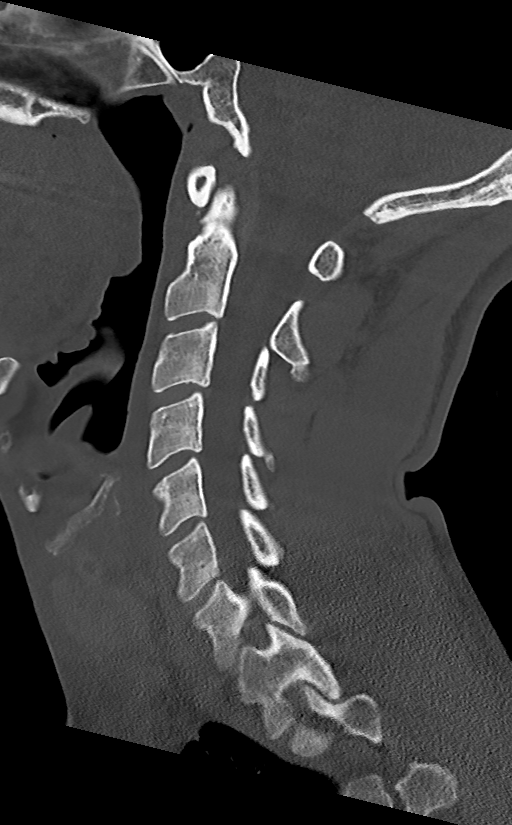
[im 31/61  soft-tissue]
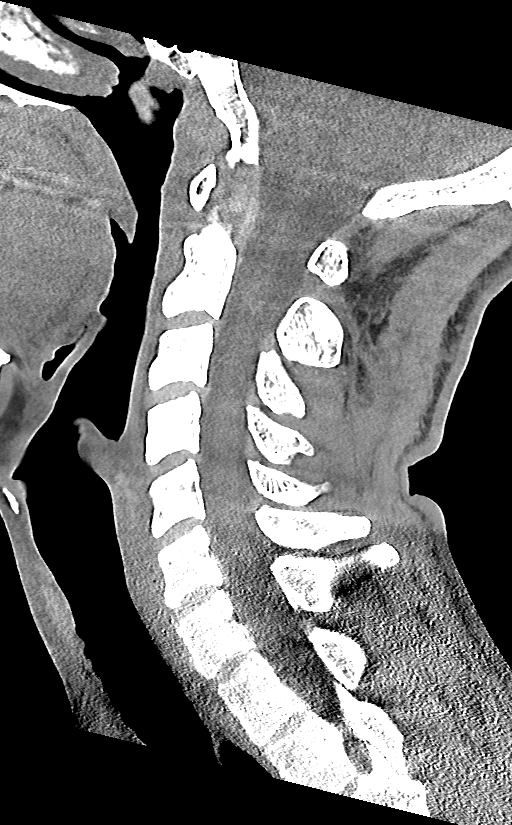
[im 31/61  bone]
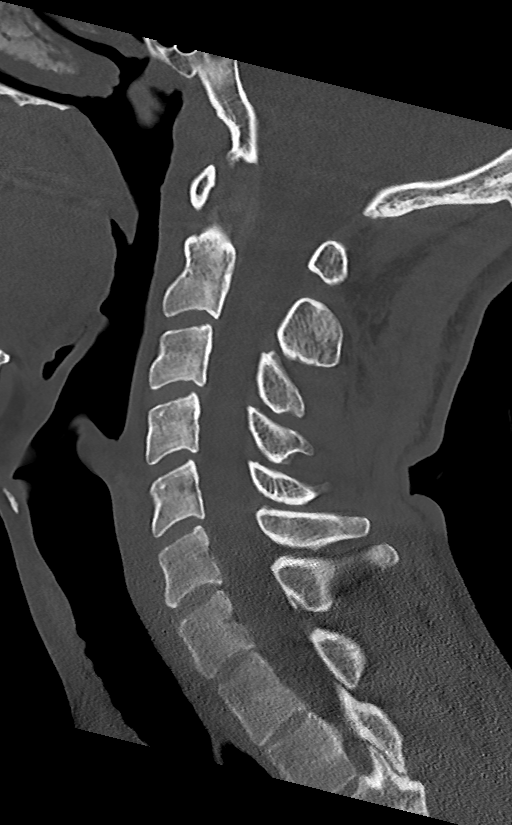
[im 36/61  bone]
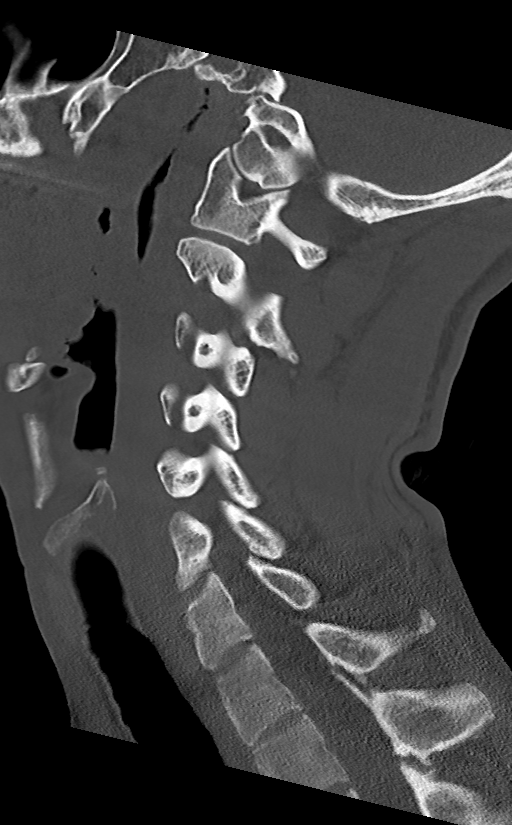
[im 41/61  bone]
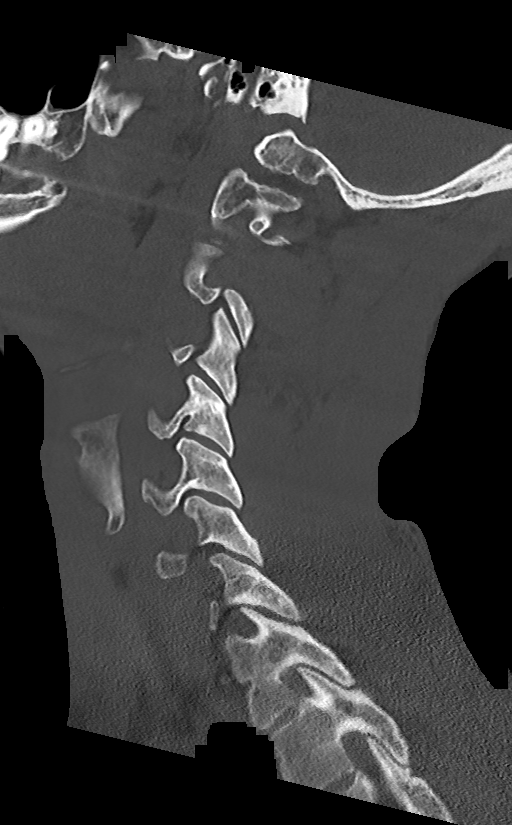

[Series 10: c_spine 2.0 cor bone · coronal · 0.29mm/px · 3 of 69 slices shown]
[im 14/69  bone]
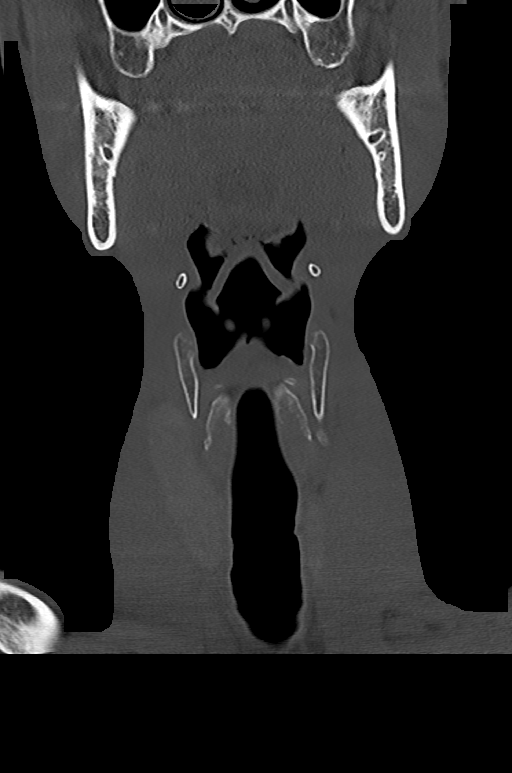
[im 28/69  bone]
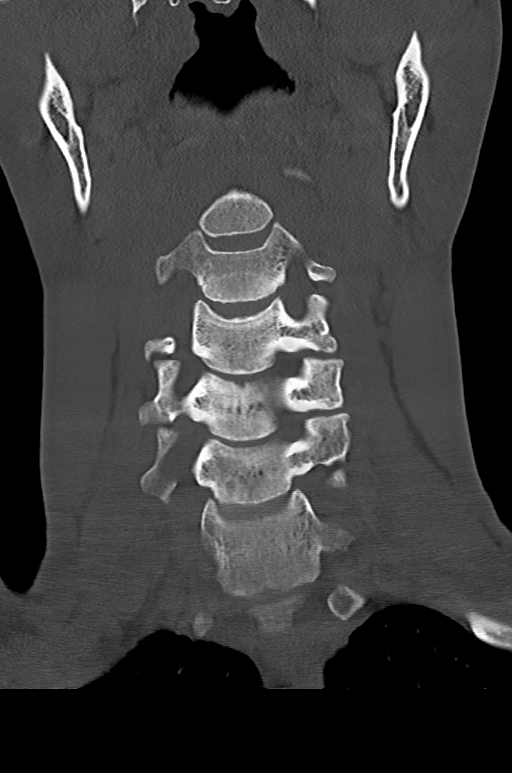
[im 41/69  bone]
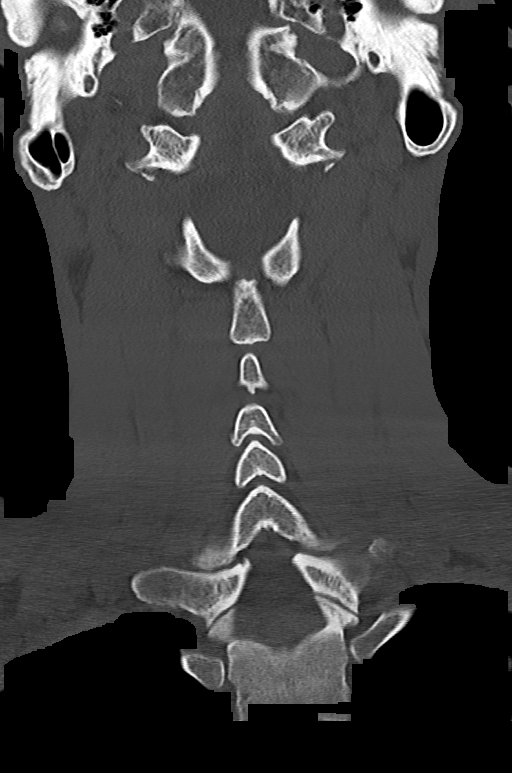

[10 of 33 positions shown; findings below may reference images not displayed]

FINDINGS: CT HEAD FINDINGS

Brain: No evidence of acute infarction, hemorrhage, hydrocephalus,
extra-axial collection or mass lesion/mass effect.

Vascular: No hyperdense vessel or unexpected calcification.

Skull: Normal. Negative for fracture or focal lesion.

Sinuses/Orbits: No acute finding.

Other: None.

CT CERVICAL SPINE FINDINGS

Alignment: There is a slight cervical dextroscoliosis. There is no
AP listhesis or other abnormal alignment. C1 is normally positioned
on C2.

Skull base and vertebrae: There is normal bone mineralization with
no evidence of fractures, with preservation of the normal vertebral
and disc heights. There is early narrowing and spurring of the
anterior atlantodental joint. No focal bone lesion is seen.

Soft tissues and spinal canal: No prevertebral fluid or swelling. No
visible canal hematoma.

Disc levels: All discs are normal in heights. There is a shallow
posterior disc protrusion mildly effacing the ventral CSF at C3-4
without mass effect. There is a slight disc bulge at C4-5. Other
levels do not show significant soft tissue or bony encroachment on
the thecal sac. There are trace facet spurs at C3-4, C4-5 and C5-6
but no levels show significant foraminal encroachment.

Upper chest: Negative.

Other:  none.
IMPRESSION: 1. No acute intracranial CT findings or depressed skull fracture.
2. Slight cervical dextroscoliosis without evidence of fractures or
listhesis. Trace facet spurring without foraminal stenosis.
3. Shallow posterior disc protrusion C3-4 without mass effect on the
cord.
4. Slight disc bulge C4-5.

ADDENDUM:
Comparison is made to cervical spine CT of 07/22/2019. The shallow
disc protrusion described above at C3-C4 was not seen at that time.
In all other respects no further changes are noted.

*** End of Addendum ***
FINDINGS: CT HEAD FINDINGS

Brain: No evidence of acute infarction, hemorrhage, hydrocephalus,
extra-axial collection or mass lesion/mass effect.

Vascular: No hyperdense vessel or unexpected calcification.

Skull: Normal. Negative for fracture or focal lesion.

Sinuses/Orbits: No acute finding.

Other: None.

CT CERVICAL SPINE FINDINGS

Alignment: There is a slight cervical dextroscoliosis. There is no
AP listhesis or other abnormal alignment. C1 is normally positioned
on C2.

Skull base and vertebrae: There is normal bone mineralization with
no evidence of fractures, with preservation of the normal vertebral
and disc heights. There is early narrowing and spurring of the
anterior atlantodental joint. No focal bone lesion is seen.

Soft tissues and spinal canal: No prevertebral fluid or swelling. No
visible canal hematoma.

Disc levels: All discs are normal in heights. There is a shallow
posterior disc protrusion mildly effacing the ventral CSF at C3-4
without mass effect. There is a slight disc bulge at C4-5. Other
levels do not show significant soft tissue or bony encroachment on
the thecal sac. There are trace facet spurs at C3-4, C4-5 and C5-6
but no levels show significant foraminal encroachment.

Upper chest: Negative.

Other:  none.
IMPRESSION: 1. No acute intracranial CT findings or depressed skull fracture.
2. Slight cervical dextroscoliosis without evidence of fractures or
listhesis. Trace facet spurring without foraminal stenosis.
3. Shallow posterior disc protrusion C3-4 without mass effect on the
cord.
4. Slight disc bulge C4-5.

## 2023-07-10 ENCOUNTER — Other Ambulatory Visit: Payer: Self-pay

## 2023-07-10 ENCOUNTER — Encounter (HOSPITAL_COMMUNITY): Payer: Self-pay

## 2023-07-10 ENCOUNTER — Observation Stay (HOSPITAL_COMMUNITY)
Admission: EM | Admit: 2023-07-10 | Discharge: 2023-07-11 | Disposition: A | Discharge status: Home / Self Care | Payer: BLUE CROSS/BLUE SHIELD | Attending: Internal Medicine | Admitting: Internal Medicine

## 2023-07-10 ENCOUNTER — Emergency Department (HOSPITAL_COMMUNITY): Payer: BLUE CROSS/BLUE SHIELD

## 2023-07-10 ENCOUNTER — Emergency Department (HOSPITAL_COMMUNITY)
Admission: EM | Admit: 2023-07-10 | Discharge: 2023-07-10 | Disposition: A | Discharge status: Home / Self Care | Payer: BLUE CROSS/BLUE SHIELD | Source: Home / Self Care | Attending: Emergency Medicine | Admitting: Emergency Medicine

## 2023-07-10 DIAGNOSIS — R569 Unspecified convulsions: Secondary | ICD-10-CM | POA: Diagnosis present

## 2023-07-10 DIAGNOSIS — M6282 Rhabdomyolysis: Secondary | ICD-10-CM | POA: Diagnosis not present

## 2023-07-10 DIAGNOSIS — Z79899 Other long term (current) drug therapy: Secondary | ICD-10-CM | POA: Diagnosis not present

## 2023-07-10 DIAGNOSIS — G40909 Epilepsy, unspecified, not intractable, without status epilepticus: Secondary | ICD-10-CM | POA: Insufficient documentation

## 2023-07-10 DIAGNOSIS — Z1152 Encounter for screening for COVID-19: Secondary | ICD-10-CM | POA: Insufficient documentation

## 2023-07-10 DIAGNOSIS — T796XXA Traumatic ischemia of muscle, initial encounter: Secondary | ICD-10-CM | POA: Insufficient documentation

## 2023-07-10 LAB — RAPID URINE DRUG SCREEN, HOSP PERFORMED
Amphetamines: NOT DETECTED
Barbiturates: NOT DETECTED
Benzodiazepines: NOT DETECTED
Cocaine: NOT DETECTED
Opiates: NOT DETECTED
Tetrahydrocannabinol: NOT DETECTED

## 2023-07-10 LAB — COMPREHENSIVE METABOLIC PANEL
ALT: 26 U/L (ref 0–44)
AST: 33 U/L (ref 15–41)
Albumin: 3.7 g/dL (ref 3.5–5.0)
Alkaline Phosphatase: 42 U/L (ref 38–126)
Anion gap: 11 (ref 5–15)
BUN: 6 mg/dL (ref 6–20)
CO2: 25 mmol/L (ref 22–32)
Calcium: 9.3 mg/dL (ref 8.9–10.3)
Chloride: 102 mmol/L (ref 98–111)
Creatinine, Ser: 0.85 mg/dL (ref 0.61–1.24)
GFR, Estimated: >60 mL/min (ref >60)
Glucose, Bld: 71 mg/dL (ref 70–99)
Potassium: 4.2 mmol/L (ref 3.5–5.1)
Sodium: 138 mmol/L (ref 135–145)
Total Bilirubin: 0.8 mg/dL (ref 0.3–1.2)
Total Protein: 6.9 g/dL (ref 6.5–8.1)

## 2023-07-10 LAB — CBC WITH DIFFERENTIAL/PLATELET
Abs Immature Granulocytes: 0.01 10*3/uL (ref 0.00–0.07)
Basophils Absolute: 0 10*3/uL (ref 0.0–0.1)
Basophils Relative: 0 %
Eosinophils Absolute: 0.1 10*3/uL (ref 0.0–0.5)
Eosinophils Relative: 2 %
HCT: 37.9 % — ABNORMAL LOW (ref 39.0–52.0)
Hemoglobin: 11.7 g/dL — ABNORMAL LOW (ref 13.0–17.0)
Immature Granulocytes: 0 %
Lymphocytes Relative: 32 %
Lymphs Abs: 1 10*3/uL (ref 0.7–4.0)
MCH: 24.8 pg — ABNORMAL LOW (ref 26.0–34.0)
MCHC: 30.9 g/dL (ref 30.0–36.0)
MCV: 80.5 fL (ref 80.0–100.0)
Monocytes Absolute: 0.2 10*3/uL (ref 0.1–1.0)
Monocytes Relative: 7 %
Neutro Abs: 1.9 10*3/uL (ref 1.7–7.7)
Neutrophils Relative %: 59 %
Platelets: 185 10*3/uL (ref 150–400)
RBC: 4.71 MIL/uL (ref 4.22–5.81)
RDW: 11.6 % (ref 11.5–15.5)
WBC: 3.2 10*3/uL — ABNORMAL LOW (ref 4.0–10.5)
nRBC: 0 % (ref 0.0–0.2)

## 2023-07-10 LAB — CBG MONITORING, ED
Glucose-Capillary: 73 mg/dL (ref 70–99)
Glucose-Capillary: 64 mg/dL — ABNORMAL LOW (ref 70–99)
Glucose-Capillary: 76 mg/dL (ref 70–99)

## 2023-07-10 LAB — ETHANOL: Alcohol, Ethyl (B): <10 mg/dL (ref <10)

## 2023-07-10 LAB — VALPROIC ACID LEVEL: Valproic Acid Lvl: 65 ug/mL (ref 50.0–100.0)

## 2023-07-10 LAB — MAGNESIUM: Magnesium: 1.8 mg/dL (ref 1.7–2.4)

## 2023-07-10 LAB — SARS CORONAVIRUS 2 BY RT PCR: SARS Coronavirus 2 by RT PCR: NEGATIVE

## 2023-07-10 MED ORDER — VALPROATE SODIUM 100 MG/ML IV SOLN
250.0000 mg | Freq: Four times a day (QID) | INTRAVENOUS | Status: DC
  Administered 2023-07-11: 250 mg via INTRAVENOUS
  Filled 2023-07-10 (×3): qty 2.5

## 2023-07-10 MED ORDER — DEXTROSE 50 % IV SOLN: 50.0000 mL | Freq: Once | INTRAVENOUS | Status: AC

## 2023-07-10 MED ORDER — ONDANSETRON HCL 4 MG/2ML IJ SOLN
4.0000 mg | Freq: Once | INTRAMUSCULAR | Status: AC
  Administered 2023-07-10: 4 mg via INTRAVENOUS
  Filled 2023-07-10: qty 2

## 2023-07-10 MED ORDER — DEXTROSE 50 % IV SOLN
INTRAVENOUS | Status: AC
  Administered 2023-07-10: 50 mL via INTRAVENOUS
  Filled 2023-07-10: qty 50

## 2023-07-10 MED ORDER — LEVETIRACETAM IN NACL 1000 MG/100ML IV SOLN
1000.0000 mg | INTRAVENOUS | Status: AC
  Administered 2023-07-10 – 2023-07-11 (×2): 1000 mg via INTRAVENOUS
  Filled 2023-07-10: qty 100

## 2023-07-10 MED ORDER — SODIUM CHLORIDE 0.9 % IV BOLUS
1000.0000 mL | Freq: Once | INTRAVENOUS | Status: AC
  Administered 2023-07-10: 1000 mL via INTRAVENOUS

## 2023-07-10 MED ORDER — SODIUM CHLORIDE 0.9 % IV SOLN: 2000.0000 mg | Freq: Once | INTRAVENOUS | Status: DC

## 2023-07-10 MED ORDER — DIVALPROEX SODIUM 250 MG PO DR TAB
500.0000 mg | DELAYED_RELEASE_TABLET | Freq: Once | ORAL | Status: AC
  Administered 2023-07-10: 500 mg via ORAL
  Filled 2023-07-10: qty 2

## 2023-07-10 NOTE — ED Notes (Signed)
Given juice per MD

## 2023-07-10 NOTE — ED Triage Notes (Signed)
BIBA for seizures, pt just discharged and walked to bus stop, bystanders say pt fall and have seizure, not seizing on arrival, 3+ pupils, has hematomas to right forehead and center forehead as well as scattered abrasions, pt vomited on arrival, is alert and follows commands

## 2023-07-10 NOTE — ED Provider Notes (Signed)
[place] EMERGENCY DEPARTMENT AT [facility] Provider Note   CSN: [id number] Arrival date & time: 07/10/23  1418     History {Add pertinent medical, surgical, social history, OB history to HPI:1} Chief Complaint  Patient presents with   Seizures    Joseph [name] is a 32 y.o. male with a reported history of seizures, on depakote 500 mg DR BID presenting with seizures.  Patient had a witnessed generalized seizure at work today.  Few minutes.  Confusion afterwards, now back to baseline mental status.  Patient denies HA, neck pain to me.  Reports he has been taking his depakote as 1000 mg at night rather than 500 mg BID in order to avoid forgetting to take it.  Denies heat exposure.  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   HPI     Home Medications Prior to Admission medications   Medication Sig Start Date End Date Taking? Authorizing Provider  amoxicillin-clavulanate (AUGMENTIN) 875-125 MG tablet Take 1 tablet by mouth every 12 (twelve) hours. 12/19/22   Cecil Cobbs, PA-C  divalproex (DEPAKOTE) 500 MG DR tablet Take 1 tablet (500 mg total) by mouth 2 (two) times daily. 12/15/21 01/14/22  Claude Manges, PA-C  naproxen (NAPROSYN) 500 MG tablet Take 1 tablet (500 mg total) by mouth 2 (two) times daily. 12/19/22   Cecil Cobbs, PA-C      Allergies    Gadolinium, Mushroom extract complex, Fish allergy, Dust mite extract, Shellfish allergy, and Tomato    Review of Systems   Review of Systems  Physical Exam Updated Vital Signs BP 122/67 (BP Location: Left Arm)   Pulse 66   Temp 97.8 F (36.6 C) (Oral)   Resp 15   SpO2 100%  Physical Exam Constitutional:      General: He is not in acute distress. HENT:     Head: Normocephalic and atraumatic.  Eyes:     Conjunctiva/sclera: Conjunctivae normal.     Pupils: Pupils are equal, round, and reactive to light.  Neck:     Comments:  No spinal midline tenderness Cardiovascular:     Rate and Rhythm: Normal rate and regular rhythm.  Pulmonary:     Effort: Pulmonary effort is normal. No respiratory distress.  Abdominal:     General: There is no distension.     Tenderness: There is no abdominal tenderness.  Musculoskeletal:     Cervical back: Normal range of motion and neck supple.  Skin:    General: Skin is warm and dry.  Neurological:     General: No focal deficit present.     Mental Status: He is alert. Mental status is at baseline.  Psychiatric:        Mood and Affect: Mood normal.        Behavior: Behavior normal.     ED Results / Procedures / Treatments   Labs (all labs ordered are listed, but only abnormal results are displayed) Labs Reviewed  COMPREHENSIVE METABOLIC PANEL  CBC WITH DIFFERENTIAL/PLATELET  VALPROIC ACID LEVEL  MAGNESIUM  ETHANOL  RAPID URINE DRUG SCREEN, HOSP PERFORMED  CBG MONITORING, ED    EKG None  Radiology No results found.  Procedures Procedures  {Document cardiac monitor, telemetry assessment procedure when appropriate:1}  Medications Ordered in ED Medications - No data to display  ED Course/ Medical Decision Making/ A&P   {   Click here for ABCD2, HEART  and other calculatorsREFRESH Note before signing :1}                          Medical Decision Making Amount and/or Complexity of Data Reviewed Labs: ordered.   This patient presents to the ED with concern for breakthrough seizure. This involves an extensive number of treatment options, and is a complaint that carries with it a high risk of complications and morbidity.  The differential diagnosis for breakthrough seizure includes medical noncompliance versus electrolyte derangement versus illicit substance use or drug intoxication versus other  Co-morbidities that complicate the patient evaluation: History of seizures  Additional history obtained from EMS, supervisor  I ordered and personally interpreted  labs.  The pertinent results include:  ***  Test considered: Patient had no evidence of significant head trauma or spinal injury or trauma.  There is no emergent indication for CT imaging of the brain or the spine at this time.  The patient was maintained on a cardiac monitor.  I personally viewed and interpreted the cardiac monitored which showed an underlying rhythm of: Sinus rhythm  I ordered medication including oral depakote for missed morning dose  I have reviewed the patients home medicines and have made adjustments as needed  After the interventions noted above, I reevaluated the patient and found that they have: improved  Social Determinants of Health: I educated the patient on the importance of taking his medications as prescribed twice daily, rather than once at night, given its half-life of 9-16 hours.  Dispostion:  After consideration of the diagnostic results and the patients response to treatment, I feel that the patent would benefit from ***.   {Document critical care time when appropriate:1} {Document review of labs and clinical decision tools ie heart score, Chads2Vasc2 etc:1}  {Document your independent review of radiology images, and any outside records:1} {Document your discussion with family members, caretakers, and with consultants:1} {Document social determinants of health affecting pt's care:1} {Document your decision making why or why not admission, treatments were needed:1} Final Clinical Impression(s) / ED Diagnoses Final diagnoses:  None    Rx / DC Orders ED Discharge Orders     None

## 2023-07-10 NOTE — Consult Note (Signed)
Neurology Consultation Reason for Consult: Breakthrough seizures  Requesting Physician: Alvester Chou  CC: Seizures  History is obtained from: Patient and chart review   HPI: Joseph [name] is a 32 y.o. 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  He had a witnessed seizure at work, feels that this was related to stress due to a coworker interaction.  He has significant reluctance to try additional antiseizure medications or increase his dosage due to concerns for medication side effects including somnolence as well as possibility of medication building up in his bloodstream over time and causing long-term side effects.  He notes that he is more willing to take Depakote capsules and tablets as he feels that the capsule builds up less.  He initially tells me he is taking his Depakote 500 mg twice daily but reported taking it 1000 mg once daily to other providers.  When I ask him specifically about this he states that sometimes he may do that it is he feels that it is the same thing as it is the same total daily dose.  After discharge from the ED, he had another witnessed seizure and was brought back in.  When asked seizure frequency he replies "it depends"  ROS: All other review of systems was negative except as noted in the HPI, with caveat of limited participation by tired patient  Past Medical History:  Diagnosis Date   Seizures (HCC)    Past Surgical History:  Procedure Laterality Date   BRAIN SURGERY     surgery to find the cause of his seizures   FINGER SURGERY       Family History  Problem Relation Age of Onset   Hypertension Father    Diabetes Maternal Grandfather     Social History:  reports that he has never smoked. He has never used smokeless tobacco. He reports that he does not drink alcohol and does not use drugs.   Exam: Current vital signs: BP (!) 144/86   Pulse 69   Temp 98.6 F (37 C) (Temporal)   Resp 18   Ht 6\' 5"  (1.956 m)   Wt 83.9  kg   SpO2 98%   BMI 21.93 kg/m  Vital signs in last 24 hours: Temp:  [97.4 F (36.3 C)-98.6 F (37 C)] 98.6 F (37 C) (07/15 2057) Pulse Rate:  [56-69] 69 (07/15 2057) Resp:  [15-18] 18 (07/15 2057) BP: (122-144)/(66-86) 144/86 (07/15 2057) SpO2:  [97 %-100 %] 98 % (07/15 2057) Weight:  [83.9 kg] 83.9 kg (07/15 2049)   Physical Exam  Constitutional: Appears thin Psych: Affect somewhat flat and mildly withdrawn but overall cooperative Eyes: No scleral injection HENT: Right frontal hematoma, scalp laceration MSK: no major joint deformities.  Cardiovascular: Perfusing extremities well Respiratory: Effort normal, non-labored breathing GI: Soft.  No distension. There is no tenderness.  Skin: Multiple scattered abrasions  Neuro: Mental Status: Patient is awake, alert, oriented to person, place, month, year, and situation. Patient is able to give a clear and coherent history. No signs of aphasia or neglect Cranial Nerves: II: Visual Fields are full. Pupils are equal, round, and reactive to light.   III,IV, VI: EOMI without ptosis or diploplia.  V: Facial sensation is symmetric to light eyelash brush VII: Facial movement is notable for inconsistent asymmetric smile, at times smiles more with the right side and at times with the left VIII: hearing is intact to voice X: Uvula elevates symmetrically XI: Shoulder shrug is symmetric. XII: tongue is  midline without atrophy or fasciculations.  Motor: Tone is normal. Bulk is normal.  Good antigravity strength in all 4 extremities, confrontational testing limited by patient participation/generalized muscle aching Sensory: Sensation is intact in all 4 extremities Deep Tendon Reflexes: Deferred by patient Cerebellar: FNF and HKS are intact bilaterally Gait:  Deferred by patient   I have reviewed labs in epic and the results pertinent to this consultation are:  Basic Metabolic Panel: Recent Labs  Lab 07/10/23 1501  NA 138  K  4.2  CL 102  CO2 25  GLUCOSE 71  BUN 6  CREATININE 0.85  CALCIUM 9.3  MG 1.8    CBC: Recent Labs  Lab 07/10/23 1501  WBC 3.2*  NEUTROABS 1.9  HGB 11.7*  HCT 37.9*  MCV 80.5  PLT 185    Coagulation Studies: No results for input(s): "LABPROT", "INR" in the last 72 hours.   No results found for: "CKTOTAL", "CKMB", "CKMBINDEX", "TROPONINI"   Lab Results  Component Value Date   VALPROATE 65 07/10/2023    ETOH neg UDS neg   I have reviewed the images obtained:  Head CT personally reviewed, agree with radiology read there is a superficial right scalp hematoma but no acute intracranial process   Impression: Breakthrough seizures in the setting of inconsistent medication adherence.  Depakote level is technically therapeutic but on the lower side and in discussion and shared decision making with the patient will increase the dose of this medication slightly rather than adding a second antiseizure medication.  He is interested in applying for disability for his epilepsy but notes he cannot afford outpatient neurology follow-up.  Preliminary Recommendations (not tolerating oral intake initially due to post ictal nausea and vomiting): - Depakote 250 mg q6hr IV to replace home 500 BID until tolerating PO - Keppra 2000 mg load, will consider maintenance pending full eval - STAT Head CT  Additional recommendations - Appreciate glucose management per ED / primary  - CK, if elevated please trend to peak and administer IV fluids as needed - Increase Depakote to 750 mg BID (capsules per patient request) - Appreciate social work resources for low-cost/free follow-up options for the patient - Seizure precautions below to be included in discharge instructions and reviewed with patient - Inpatient neurology will sign off but please do not hesitate to reach out if additional questions or concerns arise  Brooke Dare MD-PhD [facility] [phone] Available 7 PM to 7  AM, outside of these hours please call Neurologist on call as listed on Amion.

## 2023-07-10 NOTE — Discharge Instructions (Addendum)
Because of your likely seizure today, you should not drive for the next 6 months, or until you are cleared to do so by a neurologist.  Please take your seizure medications as they are normally prescribed. If you are supposed to take your medicine TWICE DAILY, make sure you are taking the pills about 12 hours apart.

## 2023-07-10 NOTE — ED Provider Notes (Signed)
[place] EMERGENCY DEPARTMENT AT [facility] Provider Note   CSN: [id number] Arrival date & time: 07/10/23  2032     History {Add pertinent medical, surgical, social history, OB history to HPI:1} Chief Complaint  Patient presents with   Seizures    Joseph [name] is a 32 y.o. male who is presenting back to the emergency department with another seizure and head injury.  Patient was seen by myself earlier today in the ED after reported breakthrough seizure, was observed for period of time in the ED with unremarkable labs and therapeutic Depakote level, and then discharged.  Upon leaving the hospital he was reportedly seen to have another seizure by bystanders on the way to the bus stop.  Patient brought back into the ED by personnel.  No longer seizing on arrival.  He has an injury to the front of the head.  Patient had vomiting earlier and reports a frontal headache.  HPI     Home Medications Prior to Admission medications   Medication Sig Start Date End Date Taking? Authorizing Provider  amoxicillin-clavulanate (AUGMENTIN) 875-125 MG tablet Take 1 tablet by mouth every 12 (twelve) hours. 12/19/22   Cecil Cobbs, PA-C  divalproex (DEPAKOTE) 500 MG DR tablet Take 1 tablet (500 mg total) by mouth 2 (two) times daily. 12/15/21 01/14/22  Claude Manges, PA-C  naproxen (NAPROSYN) 500 MG tablet Take 1 tablet (500 mg total) by mouth 2 (two) times daily. 12/19/22   Cecil Cobbs, PA-C      Allergies    Gadolinium, Mushroom extract complex, Fish allergy, Dust mite extract, Shellfish allergy, and Tomato    Review of Systems   Review of Systems  Physical Exam Updated Vital Signs BP (!) 144/86   Pulse 69   Resp 18   Ht 6\' 5"  (1.956 m)   Wt 83.9 kg   SpO2 98%   BMI 21.93 kg/m  Physical Exam Constitutional:      General: He is not in acute distress. HENT:     Head: Normocephalic.     Comments: Contusion with abrasion to the mid forehead Eyes:      Conjunctiva/sclera: Conjunctivae normal.     Pupils: Pupils are equal, round, and reactive to light.  Cardiovascular:     Rate and Rhythm: Normal rate and regular rhythm.  Pulmonary:     Effort: Pulmonary effort is normal. No respiratory distress.  Abdominal:     General: There is no distension.     Tenderness: There is no abdominal tenderness.     Comments: Dry heaving  Skin:    General: Skin is warm and dry.  Neurological:     General: No focal deficit present.     Mental Status: He is alert. Mental status is at baseline.     ED Results / Procedures / Treatments   Labs (all labs ordered are listed, but only abnormal results are displayed) Labs Reviewed - No data to display  EKG None  Radiology No results found.  Procedures Procedures  {Document cardiac monitor, telemetry assessment procedure when appropriate:1}  Medications Ordered in ED Medications - No data to display  ED Course/ Medical Decision Making/ A&P   {   Click here for ABCD2, HEART and other calculatorsREFRESH Note before signing :1}                          Medical Decision Making Amount and/or Complexity of Data Reviewed Radiology: ordered.  Risk Prescription drug management.   ***  {Document critical care time when appropriate:1} {Document review of labs and clinical decision tools ie heart score, Chads2Vasc2 etc:1}  {Document your independent review of radiology images, and any outside records:1} {Document your discussion with family members, caretakers, and with consultants:1} {Document social determinants of health affecting pt's care:1} {Document your decision making why or why not admission, treatments were needed:1} Final Clinical Impression(s) / ED Diagnoses Final diagnoses:  None    Rx / DC Orders ED Discharge Orders     None

## 2023-07-10 NOTE — ED Triage Notes (Signed)
Pt arrived via [facility] from work. Pt had a witnessed tonic clonic seizure. Per EMS, witnesses say pt was standing and his whole body locked up and he fell. Pt arrived in a c-collar. Per EMS pt c/o neck pain and jaw pain. Per EMS, pt is A&Ox1 to self only and is incontinent off bowel and bladder.

## 2023-07-10 NOTE — ED Provider Notes (Incomplete)
[place] EMERGENCY DEPARTMENT AT [facility] Provider Note   CSN: [id number] Arrival date & time: 07/10/23  1418     History {Add pertinent medical, surgical, social history, OB history to HPI:1} Chief Complaint  Patient presents with   Seizures    Joseph [name] is a 32 y.o. male with PMH as listed below who presents via [facility] from work. Pt had a witnessed tonic clonic seizure. Per EMS, witnesses say pt was standing and his whole body locked up and he fell. Pt arrived in a c-collar. Per EMS pt c/o neck pain and jaw pain. Per EMS, pt is A&Ox1 to self only and is incontinent off bowel and bladder.    Past Medical History:  Diagnosis Date   Seizures (HCC)        Home Medications Prior to Admission medications   Medication Sig Start Date End Date Taking? Authorizing Provider  amoxicillin-clavulanate (AUGMENTIN) 875-125 MG tablet Take 1 tablet by mouth every 12 (twelve) hours. 12/19/22   Cecil Cobbs, PA-C  divalproex (DEPAKOTE) 500 MG DR tablet Take 1 tablet (500 mg total) by mouth 2 (two) times daily. 12/15/21 01/14/22  Claude Manges, PA-C  naproxen (NAPROSYN) 500 MG tablet Take 1 tablet (500 mg total) by mouth 2 (two) times daily. 12/19/22   Cecil Cobbs, PA-C      Allergies    Gadolinium, Mushroom extract complex, Fish allergy, Dust mite extract, Shellfish allergy, and Tomato    Review of Systems   Review of Systems A 10 point review of systems was performed and is negative unless otherwise reported in HPI.  Physical Exam Updated Vital Signs BP 122/67 (BP Location: Left Arm)   Pulse 66   Temp 97.8 F (36.6 C) (Oral)   Resp 15   SpO2 100%  Physical Exam General: Normal appearing {Desc; male/male:11659}, lying in bed.  HEENT: PERRLA, Sclera anicteric, MMM, trachea midline.  Cardiology: RRR, no murmurs/rubs/gallops. BL radial and DP pulses equal bilaterally.  Resp: Normal respiratory rate and effort. CTAB, no wheezes, rhonchi, crackles.  Abd:  Soft, non-tender, non-distended. No rebound tenderness or guarding.  GU: Deferred. MSK: No peripheral edema or signs of trauma. Extremities without deformity or TTP. No cyanosis or clubbing. Skin: warm, dry. No rashes or lesions. Back: No CVA tenderness Neuro: A&Ox4, CNs II-XII grossly intact. MAEs. Sensation grossly intact.  Psych: Normal mood and affect.   ED Results / Procedures / Treatments   Labs (all labs ordered are listed, but only abnormal results are displayed) Labs Reviewed  COMPREHENSIVE METABOLIC PANEL  CBC WITH DIFFERENTIAL/PLATELET  VALPROIC ACID LEVEL  CBG MONITORING, ED    EKG None  Radiology No results found.  Procedures Procedures  {Document cardiac monitor, telemetry assessment procedure when appropriate:1}  Medications Ordered in ED Medications - No data to display  ED Course/ Medical Decision Making/ A&P                          Medical Decision Making Amount and/or Complexity of Data Reviewed Labs: ordered.    This patient presents to the ED for concern of ***, this involves an extensive number of treatment options, and is a complaint that carries with it a high risk of complications and morbidity.  I considered the following differential and admission for this acute, potentially life threatening condition.   MDM:    ***     Labs: I Ordered, and personally interpreted labs.  The pertinent results include:  ***  Imaging Studies ordered: I ordered imaging studies including *** I independently visualized and interpreted imaging. I agree with the radiologist interpretation  Additional history obtained from ***.  External records from outside source obtained and reviewed including ***  Cardiac Monitoring: The patient was maintained on a cardiac monitor.  I personally viewed and interpreted the cardiac monitored which showed an underlying rhythm of: ***  Reevaluation: After the interventions noted above, I reevaluated the patient and  found that they have :{resolved/improved/worsened:23923::"improved"}  Social Determinants of Health: ***  Disposition:  ***  Co morbidities that complicate the patient evaluation  Past Medical History:  Diagnosis Date   Seizures (HCC)      Medicines No orders of the defined types were placed in this encounter.   I have reviewed the patients home medicines and have made adjustments as needed  Problem List / ED Course: Problem List Items Addressed This Visit   None        {Document critical care time when appropriate:1} {Document review of labs and clinical decision tools ie heart score, Chads2Vasc2 etc:1}  {Document your independent review of radiology images, and any outside records:1} {Document your discussion with family members, caretakers, and with consultants:1} {Document social determinants of health affecting pt's care:1} {Document your decision making why or why not admission, treatments were needed:1}  This note was created using dictation software, which may contain spelling or grammatical errors.

## 2023-07-11 ENCOUNTER — Encounter (HOSPITAL_COMMUNITY): Payer: Self-pay | Admitting: Family Medicine

## 2023-07-11 DIAGNOSIS — R569 Unspecified convulsions: Secondary | ICD-10-CM | POA: Diagnosis not present

## 2023-07-11 DIAGNOSIS — T796XXA Traumatic ischemia of muscle, initial encounter: Secondary | ICD-10-CM | POA: Insufficient documentation

## 2023-07-11 LAB — CK
Total CK: 883 U/L — ABNORMAL HIGH (ref 49–397)
Total CK: 872 U/L — ABNORMAL HIGH (ref 49–397)

## 2023-07-11 LAB — BASIC METABOLIC PANEL
Anion gap: 12 (ref 5–15)
BUN: 5 mg/dL — ABNORMAL LOW (ref 6–20)
CO2: 22 mmol/L (ref 22–32)
Calcium: 8.9 mg/dL (ref 8.9–10.3)
Chloride: 103 mmol/L (ref 98–111)
Creatinine, Ser: 0.8 mg/dL (ref 0.61–1.24)
GFR, Estimated: >60 mL/min (ref >60)
Glucose, Bld: 85 mg/dL (ref 70–99)
Potassium: 3.7 mmol/L (ref 3.5–5.1)
Sodium: 137 mmol/L (ref 135–145)

## 2023-07-11 LAB — CBC
HCT: 42.5 % (ref 39.0–52.0)
Hemoglobin: 13.1 g/dL (ref 13.0–17.0)
MCH: 25.3 pg — ABNORMAL LOW (ref 26.0–34.0)
MCHC: 30.8 g/dL (ref 30.0–36.0)
MCV: 82.2 fL (ref 80.0–100.0)
Platelets: 192 10*3/uL (ref 150–400)
RBC: 5.17 MIL/uL (ref 4.22–5.81)
RDW: 11.8 % (ref 11.5–15.5)
WBC: 6.1 10*3/uL (ref 4.0–10.5)
nRBC: 0 % (ref 0.0–0.2)

## 2023-07-11 LAB — PHOSPHORUS: Phosphorus: 2.9 mg/dL (ref 2.5–4.6)

## 2023-07-11 LAB — HIV ANTIBODY (ROUTINE TESTING W REFLEX): HIV Screen 4th Generation wRfx: NONREACTIVE

## 2023-07-11 LAB — CBG MONITORING, ED: Glucose-Capillary: 72 mg/dL (ref 70–99)

## 2023-07-11 LAB — MAGNESIUM: Magnesium: 1.8 mg/dL (ref 1.7–2.4)

## 2023-07-11 MED ORDER — LACTATED RINGERS IV SOLN: INTRAVENOUS | Status: DC

## 2023-07-11 MED ORDER — ACETAMINOPHEN 650 MG RE SUPP: 650.0000 mg | Freq: Four times a day (QID) | RECTAL | Status: DC | PRN

## 2023-07-11 MED ORDER — DIVALPROEX SODIUM 250 MG PO DR TAB: 250.0000 mg | DELAYED_RELEASE_TABLET | Freq: Two times a day (BID) | ORAL | 0 refills | Status: DC

## 2023-07-11 MED ORDER — DIVALPROEX SODIUM 125 MG PO CSDR
750.0000 mg | DELAYED_RELEASE_CAPSULE | Freq: Two times a day (BID) | ORAL | Status: DC
  Administered 2023-07-11: 750 mg via ORAL
  Filled 2023-07-11: qty 6

## 2023-07-11 MED ORDER — SODIUM CHLORIDE 0.9% FLUSH
3.0000 mL | Freq: Two times a day (BID) | INTRAVENOUS | Status: DC
  Administered 2023-07-11 (×2): 3 mL via INTRAVENOUS

## 2023-07-11 MED ORDER — POLYETHYLENE GLYCOL 3350 17 G PO PACK: 17.0000 g | PACK | Freq: Every day | ORAL | Status: DC | PRN

## 2023-07-11 MED ORDER — ENOXAPARIN SODIUM 40 MG/0.4ML IJ SOSY
40.0000 mg | PREFILLED_SYRINGE | Freq: Every day | INTRAMUSCULAR | Status: DC
  Filled 2023-07-11: qty 0.4

## 2023-07-11 MED ORDER — DIVALPROEX SODIUM 500 MG PO DR TAB: 500.0000 mg | DELAYED_RELEASE_TABLET | Freq: Two times a day (BID) | ORAL | 0 refills | Status: DC

## 2023-07-11 MED ORDER — ACETAMINOPHEN 325 MG PO TABS: 650.0000 mg | ORAL_TABLET | Freq: Four times a day (QID) | ORAL | Status: DC | PRN

## 2023-07-11 NOTE — Hospital Course (Signed)
HPI: Joseph [name] is a 32 y.o. male with medical history significant for seizure disorder who is brought into the emergency department after he was seen to have a generalized seizure.    Patient had had a witnessed generalized seizure at work earlier in the day, was evaluated in the emergency department, returned to his baseline, and was discharged.  Witnesses saw him having another generalized seizure while walking to the bus stop.  EMS was called and he was brought into the emergency department.  He had hit his head and was noted to be incontinent during the episode.  He complains of a frontal headache.   He is prescribed Depakote 500 mg twice daily but it takes 1 g every evening instead.      ED Course: Upon arrival to the ED, patient is found to be afebrile and saturating well on room air with stable blood pressure.  Labs are notable for WBC 3200, negative COVID-19 PCR, and valproic acid level of 65.  There are no acute findings on head CT.   Neurology was consulted by the ED physician and the patient was given 2 g of IV Keppra, 1 L normal saline, Zofran, IV dextrose, and 250 mg IV valproate.  Significant Events: Admitted 07/10/2023   Significant Labs: Valproic acid 65 HIV negative CK  883 -->872 UDS negative  Significant Imaging Studies: CT head negative for intra-cranial abnormality. Small right frontal scalp hematoma  Antibiotic Therapy:   Procedures:   Consultants: neurology

## 2023-07-11 NOTE — ED Notes (Signed)
Pt refused to leave. Pt was escorted out by security.

## 2023-07-11 NOTE — Assessment & Plan Note (Signed)
Pt seen for breakthrough seizures. Pt admits to taking his Depakote 2 tabs(1000 mg total) only once a day despite Rx being 500 mg bid. He states that he has always done it this way. Discussed with pt that depakote was not designed for qday dosing for seizures and that he has gotten away with once a day dosing really be sheer luck.  Depakote increased to 750 mg bid. Pt states "it costs money to see a neurologist". Told pt that everything costs money and it's up to him how he chooses to spend it.  Told him if he doesn't want to pay for neurologic care, then he is risking his own life. That is up to him. Pt is conversant. No altered mental status. Ready for discharge. Will Rx 1 month of Depakote. Pt to f/u with PCP for continued depakote Rx.

## 2023-07-11 NOTE — Assessment & Plan Note (Signed)
Only mild. CK has already peaked. No renal failure. Doubt CK in the 800s will cause any renal pathology.

## 2023-07-11 NOTE — Discharge Summary (Signed)
Physician Discharge Summary   Patient name: Joseph [name]  Admit date:     07/10/2023  Discharge date: 07/11/2023  Attending Physician: Briscoe Deutscher [[id number]]  Discharge Physician: Carollee Herter   PCP: [facility]     Recommendations at discharge: f/u with PCP at discharge for refills on Depakote.  Discharge Diagnoses Principal Problem:   Seizures (HCC) Active Problems:   Traumatic rhabdomyolysis (HCC)   Resolved Diagnoses Resolved Problems:   * No resolved hospital problems. [facility] Course   HPI: Joseph [name] is a 32 y.o. male with medical history significant for seizure disorder who is brought into the emergency department after he was seen to have a generalized seizure.    Patient had had a witnessed generalized seizure at work earlier in the day, was evaluated in the emergency department, returned to his baseline, and was discharged.  Witnesses saw him having another generalized seizure while walking to the bus stop.  EMS was called and he was brought into the emergency department.  He had hit his head and was noted to be incontinent during the episode.  He complains of a frontal headache.   He is prescribed Depakote 500 mg twice daily but it takes 1 g every evening instead.      ED Course: Upon arrival to the ED, patient is found to be afebrile and saturating well on room air with stable blood pressure.  Labs are notable for WBC 3200, negative COVID-19 PCR, and valproic acid level of 65.  There are no acute findings on head CT.   Neurology was consulted by the ED physician and the patient was given 2 g of IV Keppra, 1 L normal saline, Zofran, IV dextrose, and 250 mg IV valproate.  Significant Events: Admitted 07/10/2023   Significant Labs: Valproic acid 65 HIV negative CK  883 -->872 UDS negative  Significant Imaging Studies: CT head negative for intra-cranial abnormality. Small right frontal scalp hematoma  Antibiotic  Therapy:   Procedures:   Consultants: neurology    Assessment and Plan: * Seizures (HCC) Pt seen for breakthrough seizures. Pt admits to taking his Depakote 2 tabs(1000 mg total) only once a day despite Rx being 500 mg bid. He states that he has always done it this way. Discussed with pt that depakote was not designed for qday dosing for seizures and that he has gotten away with once a day dosing really be sheer luck.  Depakote increased to 750 mg bid. Pt states "it costs money to see a neurologist". Told pt that everything costs money and it's up to him how he chooses to spend it.  Told him if he doesn't want to pay for neurologic care, then he is risking his own life. That is up to him. Pt is conversant. No altered mental status. Ready for discharge. Will Rx 1 month of Depakote. Pt to f/u with PCP for continued depakote Rx.  Traumatic rhabdomyolysis (HCC) Only mild. CK has already peaked. No renal failure. Doubt CK in the 800s will cause any renal pathology.    Procedures performed: none   Condition at discharge: stable  Exam Physical Exam Vitals and nursing note reviewed.  Constitutional:      Appearance: He is normal weight.  HENT:     Head: Normocephalic.     Comments: Abrasion on top of head. Small hematoma. Not bleeding Pulmonary:     Effort: Pulmonary effort is normal. No respiratory distress.  Skin:  Capillary Refill: Capillary refill takes less than 2 seconds.  Neurological:     General: No focal deficit present.     Mental Status: He is alert and oriented to person, place, and time.      Disposition: Home  Discharge time: greater than 30 minutes. Allergies as of 07/11/2023       Reactions   Gadolinium Hives   Sneezing and a little tight in throat within 5 minutes of administration    Gadolinium Derivatives Hives   Sneezing and a little tight in throat within 5 minutes of administration , Sneezing and a little tight in throat within 5 minutes of  administration , Sneezing and a little tight in throat within 5 minutes of administration , Sneezing and a little tight in throat within 5 minutes of administration   Mushroom Extract Complex Anaphylaxis   Other Reaction(s): Other (See Comments) Pt reported, Other reaction(s): Other (See Comments), Pt reported, Pt reported   Fish Allergy Nausea And Vomiting   Shellfish-derived Products Hives, Itching   Other Reaction(s): GI Intolerance Reports allergy to all seafood, Itchy throat, rash, including fish   Dust Mite Extract    Shellfish Allergy Other (See Comments)   Itchy throat, rash, including fish   Tomato Rash   Other reaction(s): Other (See Comments) Other reaction(s): Other (See Comments) Other reaction(s): Other (See Comments) Other Reaction(s): Other (See Comments)        Medication List     STOP taking these medications    amoxicillin-clavulanate 875-125 MG tablet Commonly known as: AUGMENTIN   naproxen 500 MG tablet Commonly known as: NAPROSYN       TAKE these medications    divalproex 500 MG DR tablet Commonly known as: Depakote Take 1 tablet (500 mg total) by mouth 2 (two) times daily. Take with 250 mg tablet for a total of 750 mg bid What changed: additional instructions   divalproex 250 MG DR tablet Commonly known as: Depakote Take 1 tablet (250 mg total) by mouth 2 (two) times daily. Take with 500 mg tablet for a total of 750 mg bid What changed: You were already taking a medication with the same name, and this prescription was added. Make sure you understand how and when to take each.        CT Head Wo Contrast  Result Date: 07/10/2023 CLINICAL DATA:  Head trauma, moderate-severe seizures, pt just discharged and walked to bus stop, bystanders say pt fall and have seizure, not seizing on arrival, 3+ pupils, has hematomas to right forehead and center forehead as well as scattered abrasion EXAM: CT HEAD WITHOUT CONTRAST TECHNIQUE: Contiguous axial images  were obtained from the base of the skull through the vertex without intravenous contrast. RADIATION DOSE REDUCTION: This exam was performed according to the departmental dose-optimization program which includes automated exposure control, adjustment of the mA and/or kV according to patient size and/or use of iterative reconstruction technique. COMPARISON:  CT head 11/15/2021 FINDINGS: Brain: No evidence of large-territorial acute infarction. No parenchymal hemorrhage. No mass lesion. No extra-axial collection. No mass effect or midline shift. No hydrocephalus. Basilar cisterns are patent. Vascular: No hyperdense vessel. Skull: No acute fracture or focal lesion. Sinuses/Orbits: Paranasal sinuses and mastoid air cells are clear. The orbits are unremarkable. Other: Right frontal scalp hematoma formation measuring up to 7 mm. IMPRESSION: No acute intracranial abnormality. Electronically Signed   By: Tish Frederickson M.D.   On: 07/10/2023 23:51   Results for orders placed or performed during the hospital  encounter of 07/10/23  SARS Coronavirus 2 by RT PCR (hospital order, performed in [facility] hospital lab) *cepheid single result test* Anterior Nasal Swab     Status: None   Collection Time: 07/10/23 10:41 PM   Specimen: Anterior Nasal Swab  Result Value Ref Range Status   SARS Coronavirus 2 by RT PCR NEGATIVE NEGATIVE Final    Comment: Performed at [facility], [address] [place], [place] [address]    Signed:  Carollee Herter DO.  [facility] 07/11/2023, 10:38 AM

## 2023-07-11 NOTE — H&P (Addendum)
History and Physical    [name]:[id number] DOB: 11-10-1991 DOA: 07/10/2023  PCP: [facility]   Patient coming from: Home   Chief Complaint: Seizure   HPI: Joseph [name] is a 32 y.o. male with medical history significant for seizure disorder who is brought into the emergency department after he was seen to have a generalized seizure.   Patient had had a witnessed generalized seizure at work earlier in the day, was evaluated in the emergency department, returned to his baseline, and was discharged.  Witnesses saw him having another generalized seizure while walking to the bus stop.  EMS was called and he was brought into the emergency department.  He had hit his head and was noted to be incontinent during the episode.  He complains of a frontal headache.  He is prescribed Depakote 500 mg twice daily but it takes 1 g every evening instead.     ED Course: Upon arrival to the ED, patient is found to be afebrile and saturating well on room air with stable blood pressure.  Labs are notable for WBC 3200, negative COVID-19 PCR, and valproic acid level of 65.  There are no acute findings on head CT.  Neurology was consulted by the ED physician and the patient was given 2 g of IV Keppra, 1 L normal saline, Zofran, IV dextrose, and 250 mg IV valproate.  Review of Systems:  ROS limited by patient's clinical condition.  Past Medical History:  Diagnosis Date   Seizures [facility]     Past Surgical History:  Procedure Laterality Date   BRAIN SURGERY     surgery to find the cause of his seizures   FINGER SURGERY      Social History:   reports that he has never smoked. He has never used smokeless tobacco. He reports that he does not drink alcohol and does not use drugs.  Allergies  Allergen Reactions   Gadolinium Hives    Sneezing and a little tight in throat within 5 minutes of administration     Mushroom Extract Complex Anaphylaxis   Fish Allergy Nausea And Vomiting    Dust Mite Extract    Shellfish Allergy Other (See Comments)    Itchy throat, rash, including fish   Tomato Rash    Other reaction(s): Other (See Comments) Other reaction(s): Other (See Comments)     Family History  Problem Relation Age of Onset   Hypertension Father    Diabetes Maternal Grandfather      Prior to Admission medications   Medication Sig Start Date End Date Taking? Authorizing Provider  amoxicillin-clavulanate (AUGMENTIN) 875-125 MG tablet Take 1 tablet by mouth every 12 (twelve) hours. 12/19/22   Cecil Cobbs, PA-C  divalproex (DEPAKOTE) 500 MG DR tablet Take 1 tablet (500 mg total) by mouth 2 (two) times daily. 12/15/21 01/14/22  Claude Manges, PA-C  naproxen (NAPROSYN) 500 MG tablet Take 1 tablet (500 mg total) by mouth 2 (two) times daily. 12/19/22   Cecil Cobbs, PA-C    Physical Exam: Vitals:   07/10/23 2049 07/10/23 2057 07/11/23 0015 07/11/23 0146  BP:  (!) 144/86 111/66 98/65  Pulse:  69 70 70  Resp:  18 18 18   Temp:  98.6 F (37 C)  98.8 F (37.1 C)  TempSrc:  Temporal    SpO2:  98% 100% 100%  Weight: 83.9 kg     Height: 6\' 5"  (1.956 m)        Constitutional: NAD,  no pallor or diaphoresis   Eyes: PERTLA, lids and conjunctivae normal ENMT: Mucous membranes are moist. Posterior pharynx clear of any exudate or lesions.   Neck: supple, no masses  Respiratory: no wheezing, no crackles. No accessory muscle use.  Cardiovascular: S1 & S2 heard, regular rate and rhythm. No extremity edema.  Abdomen: No distension, no tenderness, soft. Bowel sounds active.  Musculoskeletal: no clubbing / cyanosis. No joint deformity upper and lower extremities.   Skin: no significant rashes, lesions, ulcers. Warm, dry, well-perfused. Neurologic: CN 2-12 grossly intact. Moving all extremities. Sleeping. Wakes to voice and makes brief eye contact before returning to sleep.    Labs and Imaging on Admission: I have personally reviewed following labs and imaging  studies  CBC: Recent Labs  Lab 07/10/23 1501  WBC 3.2*  NEUTROABS 1.9  HGB 11.7*  HCT 37.9*  MCV 80.5  PLT 185   Basic Metabolic Panel: Recent Labs  Lab 07/10/23 1501  NA 138  K 4.2  CL 102  CO2 25  GLUCOSE 71  BUN 6  CREATININE 0.85  CALCIUM 9.3  MG 1.8   GFR: Estimated Creatinine Clearance: 148.1 mL/min (by C-G formula based on SCr of 0.85 mg/dL). Liver Function Tests: Recent Labs  Lab 07/10/23 1501  AST 33  ALT 26  ALKPHOS 42  BILITOT 0.8  PROT 6.9  ALBUMIN 3.7   No results for input(s): "LIPASE", "AMYLASE" in the last 168 hours. No results for input(s): "AMMONIA" in the last 168 hours. Coagulation Profile: No results for input(s): "INR", "PROTIME" in the last 168 hours. Cardiac Enzymes: No results for input(s): "CKTOTAL", "CKMB", "CKMBINDEX", "TROPONINI" in the last 168 hours. BNP (last 3 results) No results for input(s): "PROBNP" in the last 8760 hours. HbA1C: No results for input(s): "HGBA1C" in the last 72 hours. CBG: Recent Labs  Lab 07/10/23 1443 07/10/23 2108 07/10/23 2233  GLUCAP 73 64* 76   Lipid Profile: No results for input(s): "CHOL", "HDL", "LDLCALC", "TRIG", "CHOLHDL", "LDLDIRECT" in the last 72 hours. Thyroid Function Tests: No results for input(s): "TSH", "T4TOTAL", "FREET4", "T3FREE", "THYROIDAB" in the last 72 hours. Anemia Panel: No results for input(s): "VITAMINB12", "FOLATE", "FERRITIN", "TIBC", "IRON", "RETICCTPCT" in the last 72 hours. Urine analysis:    Component Value Date/Time   COLORURINE YELLOW 12/07/2022 1113   APPEARANCEUR CLEAR 12/07/2022 1113   LABSPEC 1.020 12/07/2022 1113   PHURINE 5.0 12/07/2022 1113   GLUCOSEU NEGATIVE 12/07/2022 1113   HGBUR NEGATIVE 12/07/2022 1113   BILIRUBINUR NEGATIVE 12/07/2022 1113   KETONESUR NEGATIVE 12/07/2022 1113   PROTEINUR NEGATIVE 12/07/2022 1113   UROBILINOGEN 1.0 06/20/2010 1825   NITRITE NEGATIVE 12/07/2022 1113   LEUKOCYTESUR NEGATIVE 12/07/2022 1113   Sepsis  Labs: @LABRCNTIP (procalcitonin:4,lacticidven:4) ) Recent Results (from the past 240 hour(s))  SARS Coronavirus 2 by RT PCR (hospital order, performed in [facility] hospital lab) *cepheid single result test* Anterior Nasal Swab     Status: None   Collection Time: 07/10/23 10:41 PM   Specimen: Anterior Nasal Swab  Result Value Ref Range Status   SARS Coronavirus 2 by RT PCR NEGATIVE NEGATIVE Final    Comment: Performed at [facility], [address], [place], [place] [address]     Radiological Exams on Admission: CT Head Wo Contrast  Result Date: 07/10/2023 CLINICAL DATA:  Head trauma, moderate-severe seizures, pt just discharged and walked to bus stop, bystanders say pt fall and have seizure, not seizing on arrival, 3+ pupils, has hematomas to right forehead and center  forehead as well as scattered abrasion EXAM: CT HEAD WITHOUT CONTRAST TECHNIQUE: Contiguous axial images were obtained from the base of the skull through the vertex without intravenous contrast. RADIATION DOSE REDUCTION: This exam was performed according to the departmental dose-optimization program which includes automated exposure control, adjustment of the mA and/or kV according to patient size and/or use of iterative reconstruction technique. COMPARISON:  CT head 11/15/2021 FINDINGS: Brain: No evidence of large-territorial acute infarction. No parenchymal hemorrhage. No mass lesion. No extra-axial collection. No mass effect or midline shift. No hydrocephalus. Basilar cisterns are patent. Vascular: No hyperdense vessel. Skull: No acute fracture or focal lesion. Sinuses/Orbits: Paranasal sinuses and mastoid air cells are clear. The orbits are unremarkable. Other: Right frontal scalp hematoma formation measuring up to 7 mm. IMPRESSION: No acute intracranial abnormality. Electronically Signed   By: Tish Frederickson M.D.   On: 07/10/2023 23:51     Assessment/Plan   1. Seizures  - Appreciate neurology consultation   - No  acute findings on head CT  - Loaded with 2000 mg Keppra in ED     - Check CK, UA, and UDS, continue seizure precautions, continue valproate with increase to 750 mg BID   DVT prophylaxis: Lovenox  Code Status: Full  Level of Care: Level of care: Telemetry Medical Family Communication: None present   Disposition Plan:  Patient is from: home  Anticipated d/c is to: Home  Anticipated d/c date is: 07/12/23  Patient currently: Pending neurology consultation, return to baseline  Consults called: Neurology  Admission status: Observation     Briscoe Deutscher, MD [facility]  07/11/2023, 2:06 AM

## 2023-10-27 ENCOUNTER — Other Ambulatory Visit: Payer: Self-pay

## 2023-10-27 ENCOUNTER — Emergency Department (HOSPITAL_COMMUNITY): Payer: BLUE CROSS/BLUE SHIELD

## 2023-10-27 ENCOUNTER — Encounter (HOSPITAL_COMMUNITY): Payer: Self-pay

## 2023-10-27 ENCOUNTER — Emergency Department (HOSPITAL_COMMUNITY)
Admission: EM | Admit: 2023-10-27 | Discharge: 2023-10-27 | Disposition: A | Discharge status: Home / Self Care | Payer: BLUE CROSS/BLUE SHIELD | Attending: Emergency Medicine | Admitting: Emergency Medicine

## 2023-10-27 DIAGNOSIS — Y9301 Activity, walking, marching and hiking: Secondary | ICD-10-CM | POA: Diagnosis not present

## 2023-10-27 DIAGNOSIS — Z91148 Patient's other noncompliance with medication regimen for other reason: Secondary | ICD-10-CM | POA: Insufficient documentation

## 2023-10-27 DIAGNOSIS — G40909 Epilepsy, unspecified, not intractable, without status epilepticus: Secondary | ICD-10-CM | POA: Insufficient documentation

## 2023-10-27 DIAGNOSIS — S60412A Abrasion of right middle finger, initial encounter: Secondary | ICD-10-CM | POA: Insufficient documentation

## 2023-10-27 DIAGNOSIS — Z23 Encounter for immunization: Secondary | ICD-10-CM | POA: Diagnosis not present

## 2023-10-27 DIAGNOSIS — S0083XA Contusion of other part of head, initial encounter: Secondary | ICD-10-CM | POA: Diagnosis not present

## 2023-10-27 DIAGNOSIS — W01198A Fall on same level from slipping, tripping and stumbling with subsequent striking against other object, initial encounter: Secondary | ICD-10-CM | POA: Diagnosis not present

## 2023-10-27 DIAGNOSIS — S60414A Abrasion of right ring finger, initial encounter: Secondary | ICD-10-CM | POA: Diagnosis not present

## 2023-10-27 DIAGNOSIS — R569 Unspecified convulsions: Secondary | ICD-10-CM

## 2023-10-27 DIAGNOSIS — S0990XA Unspecified injury of head, initial encounter: Secondary | ICD-10-CM | POA: Diagnosis present

## 2023-10-27 LAB — CBC
HCT: 41.9 % (ref 39.0–52.0)
Hemoglobin: 12.7 g/dL — ABNORMAL LOW (ref 13.0–17.0)
MCH: 24.4 pg — ABNORMAL LOW (ref 26.0–34.0)
MCHC: 30.3 g/dL (ref 30.0–36.0)
MCV: 80.4 fL (ref 80.0–100.0)
Platelets: 198 10*3/uL (ref 150–400)
RBC: 5.21 MIL/uL (ref 4.22–5.81)
RDW: 11.5 % (ref 11.5–15.5)
WBC: 2.1 10*3/uL — ABNORMAL LOW (ref 4.0–10.5)
nRBC: 0 % (ref 0.0–0.2)

## 2023-10-27 LAB — COMPREHENSIVE METABOLIC PANEL
ALT: 17 U/L (ref 0–44)
AST: 24 U/L (ref 15–41)
Albumin: 4.2 g/dL (ref 3.5–5.0)
Alkaline Phosphatase: 46 U/L (ref 38–126)
Anion gap: 10 (ref 5–15)
BUN: 5 mg/dL — ABNORMAL LOW (ref 6–20)
CO2: 27 mmol/L (ref 22–32)
Calcium: 9.5 mg/dL (ref 8.9–10.3)
Chloride: 97 mmol/L — ABNORMAL LOW (ref 98–111)
Creatinine, Ser: 0.9 mg/dL (ref 0.61–1.24)
GFR, Estimated: >60 mL/min (ref >60)
Glucose, Bld: 110 mg/dL — ABNORMAL HIGH (ref 70–99)
Potassium: 4.7 mmol/L (ref 3.5–5.1)
Sodium: 134 mmol/L — ABNORMAL LOW (ref 135–145)
Total Bilirubin: 0.8 mg/dL (ref 0.3–1.2)
Total Protein: 7.6 g/dL (ref 6.5–8.1)

## 2023-10-27 MED ORDER — VALPROATE SODIUM 100 MG/ML IV SOLN
1500.0000 mg | Freq: Once | INTRAVENOUS | Status: AC
  Administered 2023-10-27: 1500 mg via INTRAVENOUS
  Filled 2023-10-27: qty 15

## 2023-10-27 MED ORDER — TETANUS-DIPHTH-ACELL PERTUSSIS 5-2.5-18.5 LF-MCG/0.5 IM SUSY
0.5000 mL | PREFILLED_SYRINGE | Freq: Once | INTRAMUSCULAR | Status: AC
  Administered 2023-10-27: 0.5 mL via INTRAMUSCULAR
  Filled 2023-10-27: qty 0.5

## 2023-10-27 MED ORDER — DIVALPROEX SODIUM 500 MG PO DR TAB: 500.0000 mg | DELAYED_RELEASE_TABLET | Freq: Two times a day (BID) | ORAL | 0 refills | Status: DC

## 2023-10-27 MED ORDER — DIVALPROEX SODIUM 250 MG PO DR TAB: 1000.0000 mg | DELAYED_RELEASE_TABLET | Freq: Once | ORAL | Status: DC

## 2023-10-27 MED ORDER — DIVALPROEX SODIUM 250 MG PO DR TAB
500.0000 mg | DELAYED_RELEASE_TABLET | Freq: Once | ORAL | Status: DC
Start: 2023-10-27 — End: 2023-10-27

## 2023-10-27 NOTE — ED Provider Notes (Signed)
[place] EMERGENCY DEPARTMENT AT [facility] Provider Note   CSN: [id number] Arrival date & time: 10/27/23  1426     History {Add pertinent medical, surgical, social history, OB history to HPI:1} Chief Complaint  Patient presents with   Seizures    Joseph [name] is [name] 32 y.o. male.  Patient with history of seizures presents today with complaints of seizure. States that he has been out of his prescribed depakote that he takes for seizures for 3 days. States that he was feeling normal until prior to arrival when he reportedly fell forward and struck his head on the ground and had [name] seizure. Upon my evaluation patient is alert and oriented. Complaining of [name] headache. Arrives in [name] c collar. No sharp shooting pain down his extremities or numbness/tingling. Denies any fevers, vision changes, chest pain, shortness of breath, nausea, vomiting, or abdominal pain.   The history is provided by the patient. No language interpreter was used.  Seizures      Home Medications Prior to Admission medications   Medication Sig Start Date End Date Taking? Authorizing Provider  divalproex (DEPAKOTE) 250 MG DR tablet Take 1 tablet (250 mg total) by mouth 2 (two) times daily. Take with 500 mg tablet for [name] total of 750 mg bid 07/11/23 08/10/23  Carollee Herter, DO  divalproex (DEPAKOTE) 500 MG DR tablet Take 1 tablet (500 mg total) by mouth 2 (two) times daily. Take with 250 mg tablet for [name] total of 750 mg bid 07/11/23 08/10/23  Carollee Herter, DO      Allergies    Gadolinium, Gadolinium derivatives, Mushroom extract complex, Fish allergy, Shellfish-derived products, Dust mite extract, Shellfish allergy, and Tomato    Review of Systems   Review of Systems  Neurological:  Positive for seizures.  All other systems reviewed and are negative.   Physical Exam Updated Vital Signs BP 133/89 (BP Location: Right Arm)   Pulse 82   Temp 97.8 F (36.6 C) (Oral)   Resp 17   SpO2 99%  Physical Exam Vitals  and nursing note reviewed.  Constitutional:      General: He is not in acute distress.    Appearance: Normal appearance. He is normal weight. He is not ill-appearing, toxic-appearing or diaphoretic.  HENT:     Head: Normocephalic and atraumatic.     Comments: Hematoma noted to the left upper forehead area. No wound crepitus, or deformity. Eyes:     Extraocular Movements: Extraocular movements intact.     Pupils: Pupils are equal, round, and reactive to light.  Neck:     Comments: In [name] c collar Cardiovascular:     Rate and Rhythm: Normal rate and regular rhythm.     Heart sounds: Normal heart sounds.  Pulmonary:     Effort: Pulmonary effort is normal. No respiratory distress.     Breath sounds: Normal breath sounds.  Abdominal:     General: Abdomen is flat.     Palpations: Abdomen is soft.     Tenderness: There is no abdominal tenderness.  Musculoskeletal:        General: Normal range of motion.     Cervical back: Normal range of motion.     Comments: No tenderness to palpation of thoracic or lumbar spine.  Skin:    General: Skin is warm and dry.     Comments: Superficial skin abrasions noted to the dorsal aspect of the third and fourth fingers of the right hand.  ROM intact.  Capillary refill less than 2 seconds.  Neurological:     General: No focal deficit present.     Mental Status: He is alert and oriented to person, place, and time.     GCS: GCS eye subscore is 4. GCS verbal subscore is 5. GCS motor subscore is 6.     Cranial Nerves: No cranial nerve deficit.     Motor: No weakness.  Psychiatric:        Mood and Affect: Mood normal.        Behavior: Behavior normal.     ED Results / Procedures / Treatments   Labs (all labs ordered are listed, but only abnormal results are displayed) Labs Reviewed  CBC - Abnormal; Notable for the following components:      Result Value   WBC 2.1 (*)    Hemoglobin 12.7 (*)    MCH 24.4 (*)    All other components within normal  limits  COMPREHENSIVE METABOLIC PANEL    EKG None  Radiology No results found.  Procedures Procedures  {Document cardiac monitor, telemetry assessment procedure when appropriate:1}  Medications Ordered in ED Medications  divalproex (DEPAKOTE) DR tablet 1,000 mg (has no administration in time range)    ED Course/ Medical Decision Making/ [name]&P Clinical Course as of 10/27/23 1535  Fri Oct 27, 2023  1527 Seizure, out of meds for 3 days, takes Depakote, getting meds here. FU on CMP and imaging. If neg can dc. Back to baseline already [BH]    Clinical Course User Index [BH] Joseph [name], Joseph A, PA-C   {   Click here for ABCD2, HEART and other calculatorsREFRESH Note before signing :1}                              Medical Decision Making Amount and/or Complexity of Data Reviewed Labs: ordered. Radiology: ordered.  Risk Prescription drug management.   This patient is [name] 32 y.o. male who presents to the ED for concern of seizures, this involves an extensive number of treatment options, and is [name] complaint that carries with it [name] high risk of complications and morbidity. The emergent differential diagnosis prior to evaluation includes, but is not limited to,  nonadherance to home meds, traumatic brain injury, intracranial hemorrhage, mass or space containing lesion, infectious etiology such as meningitis, encephalitis or abscess, metabolic disturbance including hyper or hypoglycemia, hyper or hyponatremia, hyperosmolar state, uremia, hepatic failure, hypocalcemia   This is not an exhaustive differential.   Past Medical History / Co-morbidities / Social History:  has [name] past medical history of Seizures (HCC).  Additional history: Chart reviewed. Pertinent results include: seen back in July after seizure following patient taking Depakote incorrectly  Physical Exam: Physical exam performed. The pertinent findings include: Alert and oriented and neurologically intact without focal  deficits.  Lab Tests: I ordered, and personally interpreted labs.  The pertinent results include:  ***   Imaging Studies: I ordered imaging studies including CT head, cervical spine. I independently visualized and interpreted imaging which showed ***. I agree with the radiologist interpretation.   Cardiac Monitoring:  The patient was maintained on [name] cardiac monitor.  My attending physician Dr. Marland Kitchen viewed and interpreted the cardiac monitored which showed an underlying rhythm of: ***. I agree with this interpretation.   Medications: I ordered medication including ***  for ***. Reevaluation of the patient after these medicines showed that the patient {resolved/improved/worsened:23923::"improved"}. I have reviewed the patients home  medicines and have made adjustments as needed.  Consultations Obtained: I requested consultation with the ***,  and discussed lab and imaging findings as well as pertinent plan - they recommend: ***   Disposition: After consideration of the diagnostic results and the patients response to treatment, I feel that *** .   ***emergency department workup does not suggest an emergent condition requiring admission or immediate intervention beyond what has been performed at this time. The plan is: ***. The patient is safe for discharge and has been instructed to return immediately for worsening symptoms, change in symptoms or any other concerns.  I discussed this case with my attending physician Dr. Marland Kitchen who cosigned this note including patient's presenting symptoms, physical exam, and planned diagnostics and interventions. Attending physician stated agreement with plan or made changes to plan which were implemented.     {Document critical care time when appropriate:1} {Document review of labs and clinical decision tools ie heart score, Chads2Vasc2 etc:1}  {Document your independent review of radiology images, and any outside records:1} {Document your discussion with family  members, caretakers, and with consultants:1} {Document social determinants of health affecting pt's care:1} {Document your decision making why or why not admission, treatments were needed:1} Final Clinical Impression(s) / ED Diagnoses Final diagnoses:  None    Rx / DC Orders ED Discharge Orders     None

## 2023-10-27 NOTE — ED Notes (Signed)
Pt given bus pass per request at discharge.

## 2023-10-27 NOTE — Discharge Instructions (Signed)
Take the medication is prescribed  Follow-up with urology  Return for any worsening symptoms

## 2023-10-27 NOTE — ED Triage Notes (Signed)
Patient arrives by EMS with c/o seizure.   Patient does have history of seizure.  Per report bystanders witnessed patient walking and then falling face first and then started to have seizures.  Patient has hematoma noted to front left temple, collar in place on arrival.    Patient alert and oriented upon arrival.    EMS VITALS 130/80 113 90 CBG 130/80

## 2023-10-27 NOTE — ED Provider Notes (Signed)
Care assumed from previous provider at shift change. See note for full HPI.  Examination 32 year old history of seizures, lost to follow-up with neurology, has been out of his seizure medications over the last 3 days here for evaluation of seizure.  Witnessed by bystanders.  Apparently fell face forward.  He is back to baseline.  Plan to follow-up on CT scan, metabolic panel.  He is getting his home Depakote here. No etoh use, illicit substance use  If labs and imaging without significant abnormality plan to DC home    Physical Exam  BP 120/78   Pulse 76   Temp 97.8 F (36.6 C) (Oral)   Resp 14   SpO2 100%   Physical Exam Vitals and nursing note reviewed.  Constitutional:      General: He is not in acute distress.    Appearance: He is well-developed. He is not ill-appearing or diaphoretic.  HENT:     Head: Normocephalic.     Comments: Hematoma left forehead    Nose: Nose normal.     Mouth/Throat:     Mouth: Mucous membranes are moist.  Eyes:     Extraocular Movements: Extraocular movements intact.     Pupils: Pupils are equal, round, and reactive to light.  Cardiovascular:     Rate and Rhythm: Normal rate and regular rhythm.  Pulmonary:     Effort: Pulmonary effort is normal. No respiratory distress.  Abdominal:     General: There is no distension.     Palpations: Abdomen is soft.  Musculoskeletal:        General: No swelling or tenderness. Normal range of motion.     Cervical back: Normal range of motion and neck supple.  Skin:    General: Skin is warm and dry.     Capillary Refill: Capillary refill takes less than 2 seconds.  Neurological:     General: No focal deficit present.     Mental Status: He is alert and oriented to person, place, and time.     Cranial Nerves: No cranial nerve deficit.     Sensory: No sensory deficit.     Motor: No weakness.     Gait: Gait normal.     Procedures  Procedures Labs Reviewed  CBC - Abnormal; Notable for the following  components:      Result Value   WBC 2.1 (*)    Hemoglobin 12.7 (*)    MCH 24.4 (*)    All other components within normal limits  COMPREHENSIVE METABOLIC PANEL - Abnormal; Notable for the following components:   Sodium 134 (*)    Chloride 97 (*)    Glucose, Bld 110 (*)    BUN 5 (*)    All other components within normal limits   CT Head Wo Contrast  Result Date: 10/27/2023 CLINICAL DATA:  Fall, neck tenderness, seizures, hematoma to left temporal. EXAM: CT HEAD WITHOUT CONTRAST CT CERVICAL SPINE WITHOUT CONTRAST TECHNIQUE: Multidetector CT imaging of the head and cervical spine was performed following the standard protocol without intravenous contrast. Multiplanar CT image reconstructions of the cervical spine were also generated. RADIATION DOSE REDUCTION: This exam was performed according to the departmental dose-optimization program which includes automated exposure control, adjustment of the mA and/or kV according to patient size and/or use of iterative reconstruction technique. COMPARISON:  CT head and cervical spine 11/15/2021, CT head 07/10/2023 FINDINGS: CT HEAD FINDINGS Brain: There is no acute intracranial hemorrhage, extra-axial fluid collection, or acute infarct. Parenchymal volume is normal. The  ventricles are normal in size. Gray-white differentiation is preserved. The pituitary and suprasellar region are normal. There is no mass lesion. There is no mass effect or midline shift. Vascular: No hyperdense vessel or unexpected calcification. Skull: Normal. Negative for fracture or focal lesion. Sinuses/Orbits: The paranasal sinuses are clear. The globes and orbits are unremarkable. Other: The mastoid air cells and middle ear cavities are clear. There is mild left frontal scalp swelling. CT CERVICAL SPINE FINDINGS Alignment: Normal. Skull base and vertebrae: Skull base alignment is maintained. Vertebral body heights are preserved. There is no evidence of acute fracture. There is no evidence of  acute fracture. There is no suspicious osseous lesion. Soft tissues and spinal canal: No prevertebral fluid or swelling. No visible canal hematoma. Disc levels: The disc heights are preserved. There is no significant spinal canal or neural foraminal stenosis. Upper chest: The imaged lung apices are clear. Other: None. IMPRESSION: 1. No acute intracranial pathology. 2. No acute fracture or traumatic malalignment of the cervical spine. Electronically Signed   By: Lesia Hausen M.D.   On: 10/27/2023 17:41   CT Cervical Spine Wo Contrast  Result Date: 10/27/2023 CLINICAL DATA:  Fall, neck tenderness, seizures, hematoma to left temporal. EXAM: CT HEAD WITHOUT CONTRAST CT CERVICAL SPINE WITHOUT CONTRAST TECHNIQUE: Multidetector CT imaging of the head and cervical spine was performed following the standard protocol without intravenous contrast. Multiplanar CT image reconstructions of the cervical spine were also generated. RADIATION DOSE REDUCTION: This exam was performed according to the departmental dose-optimization program which includes automated exposure control, adjustment of the mA and/or kV according to patient size and/or use of iterative reconstruction technique. COMPARISON:  CT head and cervical spine 11/15/2021, CT head 07/10/2023 FINDINGS: CT HEAD FINDINGS Brain: There is no acute intracranial hemorrhage, extra-axial fluid collection, or acute infarct. Parenchymal volume is normal. The ventricles are normal in size. Gray-white differentiation is preserved. The pituitary and suprasellar region are normal. There is no mass lesion. There is no mass effect or midline shift. Vascular: No hyperdense vessel or unexpected calcification. Skull: Normal. Negative for fracture or focal lesion. Sinuses/Orbits: The paranasal sinuses are clear. The globes and orbits are unremarkable. Other: The mastoid air cells and middle ear cavities are clear. There is mild left frontal scalp swelling. CT CERVICAL SPINE FINDINGS  Alignment: Normal. Skull base and vertebrae: Skull base alignment is maintained. Vertebral body heights are preserved. There is no evidence of acute fracture. There is no evidence of acute fracture. There is no suspicious osseous lesion. Soft tissues and spinal canal: No prevertebral fluid or swelling. No visible canal hematoma. Disc levels: The disc heights are preserved. There is no significant spinal canal or neural foraminal stenosis. Upper chest: The imaged lung apices are clear. Other: None. IMPRESSION: 1. No acute intracranial pathology. 2. No acute fracture or traumatic malalignment of the cervical spine. Electronically Signed   By: Lesia Hausen M.D.   On: 10/27/2023 17:41    ED Course / MDM   Clinical Course as of 10/27/23 1847  Fri Oct 27, 2023  1527 Seizure, out of meds for 3 days, takes Depakote, getting meds here. FU on CMP and imaging. If neg can dc. Back to baseline already [BH]    Clinical Course User Index [BH] Joseph [name] A, PA-C   Care assumed from previous provider at shift change. See note for full HPI.  Examination 32 year old history of seizures, lost to follow-up with neurology, has been out of his seizure medications over the last 3  days here for evaluation of seizure.  Witnessed by bystanders.  Apparently fell face forward.  He is back to baseline.  Plan to follow-up on CT scan, metabolic panel.  He is getting his home Depakote here.  If labs and imaging without significant abnormality plan to DC home  Labs and imaging personally viewed and interpreted: CBC WBC 2.1, hemoglobin 12.7 similar to prior Metabolic panel sodium 134 CT head without significant abnormality CT cervical without significant abnormality  Patient reassessed. Discussed labs and imaging.  Tolerating p.o. intake, ambulatory at baseline.  Encouraged medication compliance, refilled his Depakote.  Placed referral to neurology  Will have him follow-up outpatient, return for any worsening  symptoms    Medical Decision Making Amount and/or Complexity of Data Reviewed External Data Reviewed: labs, radiology and notes. Labs: ordered. Decision-making details documented in ED Course. Radiology: ordered and independent interpretation performed. Decision-making details documented in ED Course.  Risk OTC drugs. Prescription drug management. Parenteral controlled substances. Decision regarding hospitalization. Diagnosis or treatment significantly limited by social determinants of health.      Linwood Dibbles, PA-C 10/27/23 Janett Labella, MD 10/27/23 1945

## 2023-10-27 NOTE — ED Notes (Signed)
Pt offered food and drinks stated he does not want the [place] sandwich, was expecting to order something from the cafeteria, pt offered water he stated he wants bottled water however I explained to pt that we do not have bottled water and so pt declined water as well. Pt ambulated to bathroom with a steady gait.

## 2024-01-14 ENCOUNTER — Encounter (HOSPITAL_COMMUNITY): Payer: Self-pay | Admitting: Emergency Medicine

## 2024-01-14 ENCOUNTER — Other Ambulatory Visit: Payer: Self-pay

## 2024-01-14 ENCOUNTER — Emergency Department (HOSPITAL_COMMUNITY)
Admission: EM | Admit: 2024-01-14 | Discharge: 2024-01-14 | Disposition: A | Discharge status: Home / Self Care | Payer: BLUE CROSS/BLUE SHIELD | Attending: Emergency Medicine | Admitting: Emergency Medicine

## 2024-01-14 DIAGNOSIS — R569 Unspecified convulsions: Secondary | ICD-10-CM | POA: Diagnosis present

## 2024-01-14 DIAGNOSIS — G40909 Epilepsy, unspecified, not intractable, without status epilepticus: Secondary | ICD-10-CM | POA: Diagnosis not present

## 2024-01-14 MED ORDER — DIVALPROEX SODIUM 500 MG PO DR TAB: 500.0000 mg | DELAYED_RELEASE_TABLET | Freq: Two times a day (BID) | ORAL | 0 refills | Status: DC

## 2024-01-14 MED ORDER — DIVALPROEX SODIUM 250 MG PO DR TAB
500.0000 mg | DELAYED_RELEASE_TABLET | Freq: Once | ORAL | Status: DC
  Filled 2024-01-14: qty 2

## 2024-01-14 NOTE — ED Triage Notes (Signed)
Pt BIB [facility] with reports of witnessed seizure while at the convenience store. Pt has lac in between his eyes and forehead. Bleeding controlled. Pt alert and oriented x 4 at this time.

## 2024-01-14 NOTE — ED Notes (Signed)
While attempting discharge pt not acknowledging this RN for discharge instructions. Pt ignoring this RN and not answering when speaking to him while eating chicken nuggets. Security to bedside.

## 2024-01-14 NOTE — ED Provider Notes (Signed)
[place] EMERGENCY DEPARTMENT AT [facility] Provider Note   CSN: [id number] Arrival date & time: 01/14/24  1742     History  Chief Complaint  Patient presents with   Seizures    Joseph [name] is a 33 y.o. male.  33 yo M with a chief complaint of a seizure.  Patient has a history of seizures and he is on Depakote.  He denies any missed dosages but tells me that he needs to get his prescription refilled.  He is back to normal now the exception that he has a headache.  He thinks he fell and hit his head on the concrete.  He denies vomiting denies confusion denies blood thinner use.   Seizures      Home Medications Prior to Admission medications   Medication Sig Start Date End Date Taking? Authorizing Provider  divalproex (DEPAKOTE) 500 MG DR tablet Take 1 tablet (500 mg total) by mouth 2 (two) times daily. Take with 250 mg tablet for a total of 750 mg bid 01/14/24 02/13/24  Melene Plan, DO      Allergies    Gadolinium, Gadolinium derivatives, Mushroom extract complex (do not select), Fish allergy, Shellfish-derived products, Dust mite extract, Shellfish allergy, and Tomato    Review of Systems   Review of Systems  Neurological:  Positive for seizures.    Physical Exam Updated Vital Signs BP 120/83 (BP Location: Left Arm)   Pulse 73   Temp 98.3 F (36.8 C) (Oral)   Resp 18   SpO2 95%  Physical Exam Vitals and nursing note reviewed.  Constitutional:      Appearance: He is well-developed.  HENT:     Head: Normocephalic.     Comments: Right frontal hematoma, small laceration at the bridge of the nose superficial Eyes:     Pupils: Pupils are equal, round, and reactive to light.  Neck:     Vascular: No JVD.  Cardiovascular:     Rate and Rhythm: Normal rate and regular rhythm.     Heart sounds: No murmur heard.    No friction rub. No gallop.  Pulmonary:     Effort: No respiratory distress.     Breath sounds: No wheezing.  Abdominal:     General: There  is no distension.     Tenderness: There is no abdominal tenderness. There is no guarding or rebound.  Musculoskeletal:        General: Normal range of motion.     Cervical back: Normal range of motion and neck supple.  Skin:    Coloration: Skin is not pale.     Findings: No rash.  Neurological:     Mental Status: He is alert and oriented to person, place, and time.  Psychiatric:        Behavior: Behavior normal.     ED Results / Procedures / Treatments   Labs (all labs ordered are listed, but only abnormal results are displayed) Labs Reviewed - No data to display  EKG None  Radiology No results found.  Procedures Procedures    Medications Ordered in ED Medications  divalproex (DEPAKOTE) DR tablet 500 mg (500 mg Oral Patient Refused/Not Given 01/14/24 1844)    ED Course/ Medical Decision Making/ A&P                                 Medical Decision Making Risk Prescription drug management.   33 yo  M with a history of a seizure disorder who comes in with a chief complaint of a seizure.  The patient is supposed be on Depakote.  I suspect he is likely not compliant, he tells me that he does not see a neurologist and he is unwilling to change his medications due to their expense.  Had a long discussion with him at bedside.  I offered to perform local wound care on his wounds which he declined.  I tried to give him a dose of Depakote here which she also has declined.  I will refer him to neurology.  Consult and the social work to try and help him establish care with neurology as well as to see if there is some way they can help him with his medications or perhaps get him on disability.   7:31 PM:  I have discussed the diagnosis/risks/treatment options with the patient.  Evaluation and diagnostic testing in the emergency department does not suggest an emergent condition requiring admission or immediate intervention beyond what has been performed at this time.  They will follow  up with PCP. We also discussed returning to the ED immediately if new or worsening sx occur. We discussed the sx which are most concerning (e.g., sudden worsening pain, fever, inability to tolerate by mouth) that necessitate immediate return. Medications administered to the patient during their visit and any new prescriptions provided to the patient are listed below.  Medications given during this visit Medications  divalproex (DEPAKOTE) DR tablet 500 mg (500 mg Oral Patient Refused/Not Given 01/14/24 1844)     The patient appears reasonably screen and/or stabilized for discharge and I doubt any other medical condition or other [facility] requiring further screening, evaluation, or treatment in the ED at this time prior to discharge.          Final Clinical Impression(s) / ED Diagnoses Final diagnoses:  Seizure [facility])    Rx / DC Orders ED Discharge Orders          Ordered    Ambulatory referral to Neurology       Comments: Seizure disorder   01/14/24 1840    divalproex (DEPAKOTE) 500 MG DR tablet  2 times daily        01/14/24 1841              Melene Plan, DO 01/14/24 1931

## 2024-01-14 NOTE — ED Notes (Signed)
Pt refusing Depakote, MD Adela Lank made aware

## 2024-01-14 NOTE — Discharge Instructions (Addendum)
You have a seizure disorder.  That means that you should take the medications that you are prescribed to try and prevent having another seizure.  Every time you have a seizure you should not drive a car climbing to [place] swim by yourself for 6 months.  I have put a order in for the social worker to try and get in touch with you.  Please talk with them and see if they can help you try to figure out how to get to a neurologist as well as how to get your medications paid for.

## 2024-03-01 ENCOUNTER — Encounter (HOSPITAL_COMMUNITY): Payer: Self-pay

## 2024-03-01 ENCOUNTER — Other Ambulatory Visit: Payer: Self-pay

## 2024-03-01 ENCOUNTER — Emergency Department (HOSPITAL_COMMUNITY)
Admission: EM | Admit: 2024-03-01 | Discharge: 2024-03-01 | Disposition: A | Discharge status: Home / Self Care | Payer: Self-pay | Attending: Emergency Medicine | Admitting: Emergency Medicine

## 2024-03-01 DIAGNOSIS — R569 Unspecified convulsions: Secondary | ICD-10-CM | POA: Insufficient documentation

## 2024-03-01 DIAGNOSIS — Y990 Civilian activity done for income or pay: Secondary | ICD-10-CM | POA: Insufficient documentation

## 2024-03-01 DIAGNOSIS — X58XXXA Exposure to other specified factors, initial encounter: Secondary | ICD-10-CM | POA: Insufficient documentation

## 2024-03-01 DIAGNOSIS — S00512A Abrasion of oral cavity, initial encounter: Secondary | ICD-10-CM | POA: Insufficient documentation

## 2024-03-01 LAB — CBC WITH DIFFERENTIAL/PLATELET
Abs Immature Granulocytes: 0.02 10*3/uL (ref 0.00–0.07)
Basophils Absolute: 0 10*3/uL (ref 0.0–0.1)
Basophils Relative: 0 %
Eosinophils Absolute: 0.1 10*3/uL (ref 0.0–0.5)
Eosinophils Relative: 2 %
HCT: 38 % — ABNORMAL LOW (ref 39.0–52.0)
Hemoglobin: 12 g/dL — ABNORMAL LOW (ref 13.0–17.0)
Immature Granulocytes: 1 %
Lymphocytes Relative: 37 %
Lymphs Abs: 1.1 10*3/uL (ref 0.7–4.0)
MCH: 25.4 pg — ABNORMAL LOW (ref 26.0–34.0)
MCHC: 31.6 g/dL (ref 30.0–36.0)
MCV: 80.3 fL (ref 80.0–100.0)
Monocytes Absolute: 0.2 10*3/uL (ref 0.1–1.0)
Monocytes Relative: 7 %
Neutro Abs: 1.6 10*3/uL — ABNORMAL LOW (ref 1.7–7.7)
Neutrophils Relative %: 53 %
Platelets: 190 10*3/uL (ref 150–400)
RBC: 4.73 MIL/uL (ref 4.22–5.81)
RDW: 11.7 % (ref 11.5–15.5)
WBC: 3 10*3/uL — ABNORMAL LOW (ref 4.0–10.5)
nRBC: 0 % (ref 0.0–0.2)

## 2024-03-01 LAB — MAGNESIUM: Magnesium: 1.8 mg/dL (ref 1.7–2.4)

## 2024-03-01 LAB — BASIC METABOLIC PANEL
Anion gap: 10 (ref 5–15)
BUN: 13 mg/dL (ref 6–20)
CO2: 25 mmol/L (ref 22–32)
Calcium: 9.1 mg/dL (ref 8.9–10.3)
Chloride: 102 mmol/L (ref 98–111)
Creatinine, Ser: 0.85 mg/dL (ref 0.61–1.24)
GFR, Estimated: >60 mL/min (ref >60)
Glucose, Bld: 81 mg/dL (ref 70–99)
Potassium: 4 mmol/L (ref 3.5–5.1)
Sodium: 137 mmol/L (ref 135–145)

## 2024-03-01 LAB — VALPROIC ACID LEVEL: Valproic Acid Lvl: 54 ug/mL (ref 50.0–100.0)

## 2024-03-01 LAB — CBG MONITORING, ED: Glucose-Capillary: 76 mg/dL (ref 70–99)

## 2024-03-01 MED ORDER — DIVALPROEX SODIUM 250 MG PO DR TAB
500.0000 mg | DELAYED_RELEASE_TABLET | Freq: Once | ORAL | Status: DC
  Filled 2024-03-01: qty 2

## 2024-03-01 MED ORDER — DIVALPROEX SODIUM 500 MG PO DR TAB
500.0000 mg | DELAYED_RELEASE_TABLET | Freq: Two times a day (BID) | ORAL | 0 refills | Status: DC
Start: 2024-03-01 — End: 2024-08-15

## 2024-03-01 NOTE — ED Notes (Signed)
Assumed pt care from Sara RN

## 2024-03-01 NOTE — Discharge Instructions (Addendum)
 Follow up outpatient, return for any worsening symptoms

## 2024-03-01 NOTE — ED Provider Notes (Signed)
 [place] EMERGENCY DEPARTMENT AT [facility] Provider Note   CSN: [id number] Arrival date & time: 03/01/24  1649    History  Chief Complaint  Patient presents with   Seizures    Joseph [name] is a 33 y.o. male here for evaluation of seizure-like activity.  Had a seizure while he was at work.  He is unsure if he hit his head.  Unknown how long he had seizure.  He denies any tongue injury, bowel or bladder incontinence.  He states he spaces out his Depakote at home as he typically gets refills through the emergency department which she has not been seen in about 2 months and he does not follow with neurology outpatient.  States he has been otherwise well.  No recent illnesses.  Eating and abd pain, numbness or weakness.  He was ambulatory at the scene by the time EMS got there.  Admits to some sporadic EtOH use however denies any chronic use, history of DTs, withdrawal seizures.  On arrival patient states he does not want any imaging done as he has had multiple CT scans of his head and does not want the radiation    HPI     Home Medications Prior to Admission medications   Medication Sig Start Date End Date Taking? Authorizing Provider  divalproex (DEPAKOTE) 500 MG DR tablet Take 1 tablet (500 mg total) by mouth 2 (two) times daily. Take with 250 mg tablet for a total of 750 mg bid 03/01/24 03/31/24  Haizel Gatchell A, PA-C      Allergies    Gadolinium, Gadolinium derivatives, Mushroom extract complex (obsolete), Fish allergy, Shellfish-derived products, Dust mite extract, Shellfish allergy, and Tomato    Review of Systems   Review of Systems  Constitutional: Negative.   HENT: Negative.    Respiratory: Negative.    Cardiovascular: Negative.   Gastrointestinal: Negative.   Genitourinary: Negative.   Musculoskeletal: Negative.   Skin: Negative.   Neurological:  Positive for seizures.  All other systems reviewed and are negative.   Physical Exam Updated Vital  Signs BP 121/72   Pulse 62   Temp (!) 97.2 F (36.2 C) (Oral)   Resp 16   Ht 6\' 5"  (1.956 m)   Wt 88.5 kg   SpO2 96%   BMI 23.12 kg/m  Physical Exam Vitals and nursing note reviewed.  Constitutional:      General: He is not in acute distress.    Appearance: He is well-developed. He is not ill-appearing, toxic-appearing or diaphoretic.  HENT:     Head: Normocephalic and atraumatic.     Jaw: There is normal jaw occlusion.     Comments: Nontender jaw, no drooling, dysphagia or trismus    Nose: Nose normal.     Mouth/Throat:     Lips: Pink.     Mouth: Mucous membranes are moist.     Pharynx: Oropharynx is clear. Uvula midline.     Comments: No loose dentition Superficial abrasion distal right aspect of tongue, no lacerations Eyes:     Pupils: Pupils are equal, round, and reactive to light.  Neck:     Trachea: Trachea and phonation normal.     Comments: No midline tenderness, full range of motion Cardiovascular:     Rate and Rhythm: Normal rate and regular rhythm.     Pulses: Normal pulses.          Radial pulses are 2+ on the right side and 2+ on the left side.  Dorsalis pedis pulses are 2+ on the right side and 2+ on the left side.     Heart sounds: Normal heart sounds.  Pulmonary:     Effort: Pulmonary effort is normal. No respiratory distress.     Breath sounds: Normal breath sounds and air entry.     Comments: Clear bilaterally, speaks in full sentences without difficulty Chest:     Comments: Nontender, no step-off, crepitus Abdominal:     General: Bowel sounds are normal. There is no distension.     Palpations: Abdomen is soft.     Tenderness: There is no abdominal tenderness.     Comments: Soft, nontender  Musculoskeletal:        General: Normal range of motion.     Cervical back: Full passive range of motion without pain, normal range of motion and neck supple.     Comments: No bony tenderness, full range of motion, no midline C/T/L tenderness  Skin:     General: Skin is warm and dry.     Capillary Refill: Capillary refill takes less than 2 seconds.     Comments: No obvious contusions, abrasions, hematomas  Neurological:     General: No focal deficit present.     Mental Status: He is alert and oriented to person, place, and time.     Cranial Nerves: Cranial nerves 2-12 are intact.     Sensory: Sensation is intact.     Motor: Motor function is intact.     Gait: Gait is intact.     Comments: Cranial nerves II through XII grossly intact Intact sensation bilaterally Equal strength bilaterally Ambulatory thought ataxic gait Alert to person, place, time     ED Results / Procedures / Treatments   Labs (all labs ordered are listed, but only abnormal results are displayed) Labs Reviewed  CBC WITH DIFFERENTIAL/PLATELET - Abnormal; Notable for the following components:      Result Value   WBC 3.0 (*)    Hemoglobin 12.0 (*)    HCT 38.0 (*)    MCH 25.4 (*)    Neutro Abs 1.6 (*)    All other components within normal limits  BASIC METABOLIC PANEL  VALPROIC ACID LEVEL  MAGNESIUM  CBG MONITORING, ED    EKG None  Radiology No results found.  Procedures Procedures    Medications Ordered in ED Medications  divalproex (DEPAKOTE) DR tablet 500 mg (500 mg Oral Not Given 03/01/24 1925)    ED Course/ Medical Decision Making/ A&P   33 year old here for evaluation of seizure-like activity.  Has known history of seizures.  Sounds like he has not followed with neurology in quite some time and typically he states he obtains his Depakote medication when he has a seizure from the emergency department.  He took a dose earlier today.  Recommended CT scan as well as labs and Depakote loading dose here. He states he does not want any imaging performed as he has had multiple head CTs and does not want the radiation.  I also discussed labs.  He states he will "think about it."  He does not want a Depakote load here.  He states he took his Depakote  medicine at home this morning.  Will give him his nighttime dose as he supposed to be on twice daily.  He does not want an IV placed at this time.  Labs personally viewed and interpreted   Patient reassessed.  Discussed labs.  Continue declines any imaging.  He refused Depakote offered  here in the ED by myself as well as from nursing.  On reassessment he was agitated stating that no one offered him a "hot meal."  I offered him will be had available, bag sandwiches, crackers, applesauce however patient declines this.  He was observed for most 3 hours from the emergency department without any seizure-like activity.  Ambulatory here without any difficulty, tolerating oral liquids here.  I suspect seizure-like activity in setting of medication noncompliance.  Low suspicion for withdrawal seizures, electrolyte abnormalities, intracranial abnormality however patient did decline any head imaging here (voiced understanding of risk versus benefit, declines any imaging) infectious process.  The patient has been appropriately medically screened and/or stabilized in the ED. I have low suspicion for any other emergent medical condition which would require further screening, evaluation or treatment in the ED or require inpatient management.  Patient is hemodynamically stable and in no acute distress.  Patient able to ambulate in department prior to ED.  Evaluation does not show acute pathology that would require ongoing or additional emergent interventions while in the emergency department or further inpatient treatment.  I have discussed the diagnosis with the patient and answered all questions.  Pain is been managed while in the emergency department and patient has no further complaints prior to discharge.  Patient is comfortable with plan discussed in room and is stable for discharge at this time.  I have discussed strict return precautions for returning to the emergency department.  Patient was encouraged to  follow-up with PCP/specialist refer to at discharge.                                   Medical Decision Making Amount and/or Complexity of Data Reviewed Independent Historian: parent External Data Reviewed: labs, radiology and notes. Labs: ordered. Decision-making details documented in ED Course.  Risk OTC drugs. Prescription drug management. Decision regarding hospitalization. Diagnosis or treatment significantly limited by social determinants of health.          Final Clinical Impression(s) / ED Diagnoses Final diagnoses:  Seizure-like activity (HCC)    Rx / DC Orders ED Discharge Orders          Ordered    divalproex (DEPAKOTE) 500 MG DR tablet  2 times daily        03/01/24 1931              Christia Domke A, PA-C 03/01/24 1934    Virgina Norfolk, DO 03/01/24 2235

## 2024-03-01 NOTE — ED Triage Notes (Signed)
 Pt BIB [facility] from work after having a seizure. Pt fell from standing, seized for an unknown amount of time, then called EMS. Pt reports no changes in meds or diet, feels generally healthy but has been more stressed lately. VSS per EMS, pt ambulatory and A[facility].

## 2024-03-01 NOTE — ED Notes (Signed)
 Attempted to give patient depakote but pt refused to take it and stated he already took it today and does not want another dose.

## 2024-06-19 ENCOUNTER — Emergency Department (HOSPITAL_COMMUNITY)
Admission: EM | Admit: 2024-06-19 | Discharge: 2024-06-19 | Disposition: A | Discharge status: Home / Self Care | Payer: Self-pay | Attending: Emergency Medicine | Admitting: Emergency Medicine

## 2024-06-19 ENCOUNTER — Other Ambulatory Visit: Payer: Self-pay

## 2024-06-19 DIAGNOSIS — R569 Unspecified convulsions: Secondary | ICD-10-CM | POA: Insufficient documentation

## 2024-06-19 LAB — I-STAT CHEM 8, ED
BUN: 5 mg/dL — ABNORMAL LOW (ref 6–20)
Calcium, Ion: 1.25 mmol/L (ref 1.15–1.40)
Chloride: 98 mmol/L (ref 98–111)
Creatinine, Ser: 0.9 mg/dL (ref 0.61–1.24)
Glucose, Bld: 85 mg/dL (ref 70–99)
HCT: 43 % (ref 39.0–52.0)
Hemoglobin: 14.6 g/dL (ref 13.0–17.0)
Potassium: 3.7 mmol/L (ref 3.5–5.1)
Sodium: 138 mmol/L (ref 135–145)
TCO2: 28 mmol/L (ref 22–32)

## 2024-06-19 LAB — VALPROIC ACID LEVEL: Valproic Acid Lvl: 13 ug/mL — ABNORMAL LOW (ref 50–100)

## 2024-06-19 MED ORDER — DIVALPROEX SODIUM 250 MG PO DR TAB: 500.0000 mg | DELAYED_RELEASE_TABLET | Freq: Once | ORAL | Status: DC

## 2024-06-19 NOTE — ED Notes (Signed)
 This RN called lab regarding in process valproic acid , reports have received specimen .

## 2024-06-19 NOTE — ED Notes (Addendum)
 Pt ambulated to restroom without assistance. Pt placed back on monitor upon ambulating back to room.

## 2024-06-19 NOTE — ED Triage Notes (Signed)
 According to [facility]: Pt had a 90 second seizure witnessed by coworkers at work. Pt reports this is the first one today.Pt was post ictal on ems arrival but became oriented x4 while riding in the truck. Pt has a history of seizure and its unknown if he is compliant with his medication.  18 gauge piv placed by ems in the truck.  Vitals:  BP 128/80 Hr 65 Rr 18 Cbg 93

## 2024-06-19 NOTE — ED Notes (Signed)
 Phlebotomy asked to stick

## 2024-06-19 NOTE — ED Provider Notes (Signed)
 Patient has remained stable.  Valproic acid  level was very low.  So probably not taking his Depakote .  He states he does have it at home.  I will give 500 mg tablet here and then he will continue 500 mg of the DR twice a day gave him referral information to follow-up with [facility] neurology as well as wellness clinic.  Work note provided.   [name], MD 06/19/24 2137

## 2024-06-19 NOTE — ED Provider Notes (Signed)
  EMERGENCY DEPARTMENT AT [facility] Provider Note   CSN: [id number] Arrival date & time: 06/19/24  1339     Patient presents with: No chief complaint on file.   Joseph [name] is a 33 y.o. male.   Patient is a 33 year old male with a history of seizure disorder currently on Depakote .  Patient was brought in by EMS today because they report that patient had a 92nd episode of a witnessed seizure by coworkers at work.  They reported that he was rolling around on the ground.  When EMS arrived they noted him to be postictal but he became oriented and already in the chart.  Patient reports he has been compliant with his medications.  Denies any other acute issues at this time and is still very drowsy.  The history is provided by the patient, the EMS personnel and medical records.       Prior to Admission medications   Medication Sig Start Date End Date Taking? Authorizing Provider  divalproex  (DEPAKOTE ) 500 MG DR tablet Take 1 tablet (500 mg total) by mouth 2 (two) times daily. Take with 250 mg tablet for a total of 750 mg bid 03/01/24 03/31/24  Henderly, Britni A, PA-C    Allergies: Gadolinium, Gadolinium derivatives, Mushroom extract complex (obsolete), Fish allergy, Shellfish-derived products, Dust mite extract, Shellfish allergy, and Tomato    Review of Systems  Updated Vital Signs BP 125/67 (BP Location: Right Arm)   Pulse 69   Temp (!) 97.2 F (36.2 C) (Oral)   Resp 16   Ht 6' 6 (1.981 m)   Wt 83.9 kg   SpO2 100%   BMI 21.38 kg/m   Physical Exam Vitals and nursing note reviewed.  Constitutional:      General: He is not in acute distress.    Appearance: He is well-developed.     Comments: Sleepy but does arouse to voice  HENT:     Head: Normocephalic and atraumatic.   Eyes:     Conjunctiva/sclera: Conjunctivae normal.     Pupils: Pupils are equal, round, and reactive to light.    Cardiovascular:     Rate and Rhythm: Normal rate and regular  rhythm.     Heart sounds: No murmur heard. Pulmonary:     Effort: Pulmonary effort is normal. No respiratory distress.     Breath sounds: Normal breath sounds. No wheezing or rales.  Abdominal:     General: There is no distension.     Palpations: Abdomen is soft.     Tenderness: There is no abdominal tenderness. There is no guarding or rebound.   Musculoskeletal:        General: No tenderness. Normal range of motion.     Cervical back: Normal range of motion and neck supple.   Skin:    General: Skin is warm and dry.     Findings: No erythema or rash.   Neurological:     Mental Status: He is oriented to person, place, and time.   Psychiatric:        Behavior: Behavior normal.     (all labs ordered are listed, but only abnormal results are displayed) Labs Reviewed  VALPROIC ACID  LEVEL  I-STAT CHEM 8, ED    EKG: None  Radiology: No results found.   Procedures   Medications Ordered in the ED - No data to display  Medical Decision Making Amount and/or Complexity of Data Reviewed Labs: ordered.   Patient with a known seizure disorder presenting today after a seizure witnessed by coworkers.  Does not have any obvious signs of trauma.  Reports he takes Depakote  and has been compliant with his medications.  Patient is still sleepy but otherwise appropriate.     Final diagnoses:  None    ED Discharge Orders     None          Doretha Folks, MD 06/19/24 [phone]

## 2024-06-19 NOTE — ED Notes (Signed)
 Pt refused to allow vitals or finish triage questions until he could use the restroom.

## 2024-06-19 NOTE — Discharge Instructions (Addendum)
 Take your Depakote  DR tablet 500 mg twice a day as directed.  Follow-up with the wellness clinic.  Also follow-up with [facility] neurology information provided above call and set up appointments.  Work note provided to be out of work on Thursday.

## 2024-08-15 ENCOUNTER — Encounter (HOSPITAL_COMMUNITY): Payer: Self-pay | Admitting: Emergency Medicine

## 2024-08-15 ENCOUNTER — Other Ambulatory Visit: Payer: Self-pay

## 2024-08-15 ENCOUNTER — Emergency Department (HOSPITAL_COMMUNITY): Payer: Self-pay

## 2024-08-15 ENCOUNTER — Other Ambulatory Visit (HOSPITAL_COMMUNITY)

## 2024-08-15 ENCOUNTER — Emergency Department (HOSPITAL_COMMUNITY)
Admission: EM | Admit: 2024-08-15 | Discharge: 2024-08-15 | Disposition: A | Discharge status: Home / Self Care | Payer: Self-pay

## 2024-08-15 DIAGNOSIS — S01111A Laceration without foreign body of right eyelid and periocular area, initial encounter: Secondary | ICD-10-CM | POA: Insufficient documentation

## 2024-08-15 DIAGNOSIS — W228XXA Striking against or struck by other objects, initial encounter: Secondary | ICD-10-CM | POA: Insufficient documentation

## 2024-08-15 DIAGNOSIS — R569 Unspecified convulsions: Secondary | ICD-10-CM | POA: Insufficient documentation

## 2024-08-15 DIAGNOSIS — F840 Autistic disorder: Secondary | ICD-10-CM | POA: Insufficient documentation

## 2024-08-15 LAB — BASIC METABOLIC PANEL WITH GFR
Anion gap: 14 (ref 5–15)
BUN: 10 mg/dL (ref 6–20)
CO2: 20 mmol/L — ABNORMAL LOW (ref 22–32)
Calcium: 9 mg/dL (ref 8.9–10.3)
Chloride: 104 mmol/L (ref 98–111)
Creatinine, Ser: 0.84 mg/dL (ref 0.61–1.24)
GFR, Estimated: >60 mL/min (ref >60)
Glucose, Bld: 78 mg/dL (ref 70–99)
Potassium: 4.1 mmol/L (ref 3.5–5.1)
Sodium: 138 mmol/L (ref 135–145)

## 2024-08-15 LAB — CBC WITH DIFFERENTIAL/PLATELET
Abs Immature Granulocytes: 0 K/uL (ref 0.00–0.07)
Basophils Absolute: 0 K/uL (ref 0.0–0.1)
Basophils Relative: 1 %
Eosinophils Absolute: 0.1 K/uL (ref 0.0–0.5)
Eosinophils Relative: 4 %
HCT: 41 % (ref 39.0–52.0)
Hemoglobin: 12.4 g/dL — ABNORMAL LOW (ref 13.0–17.0)
Immature Granulocytes: 0 %
Lymphocytes Relative: 36 %
Lymphs Abs: 1.2 K/uL (ref 0.7–4.0)
MCH: 24.8 pg — ABNORMAL LOW (ref 26.0–34.0)
MCHC: 30.2 g/dL (ref 30.0–36.0)
MCV: 81.8 fL (ref 80.0–100.0)
Monocytes Absolute: 0.2 K/uL (ref 0.1–1.0)
Monocytes Relative: 6 %
Neutro Abs: 1.7 K/uL (ref 1.7–7.7)
Neutrophils Relative %: 53 %
Platelets: 190 K/uL (ref 150–400)
RBC: 5.01 MIL/uL (ref 4.22–5.81)
RDW: 12.1 % (ref 11.5–15.5)
WBC: 3.2 K/uL — ABNORMAL LOW (ref 4.0–10.5)
nRBC: 0 % (ref 0.0–0.2)

## 2024-08-15 LAB — VALPROIC ACID LEVEL: Valproic Acid Lvl: 66 ug/mL (ref 50–100)

## 2024-08-15 MED ORDER — LIDOCAINE-EPINEPHRINE (PF) 2 %-1:200000 IJ SOLN
10.0000 mL | Freq: Once | INTRAMUSCULAR | Status: AC
  Administered 2024-08-15: 10 mL
  Filled 2024-08-15: qty 20

## 2024-08-15 MED ORDER — DIVALPROEX SODIUM 250 MG PO DR TAB
250.0000 mg | DELAYED_RELEASE_TABLET | Freq: Once | ORAL | Status: DC
  Filled 2024-08-15: qty 1

## 2024-08-15 MED ORDER — LEVETIRACETAM 500 MG PO TABS
1000.0000 mg | ORAL_TABLET | Freq: Once | ORAL | Status: DC
  Filled 2024-08-15: qty 2

## 2024-08-15 MED ORDER — DIVALPROEX SODIUM 500 MG PO DR TAB: 500.0000 mg | DELAYED_RELEASE_TABLET | Freq: Two times a day (BID) | ORAL | 0 refills | Status: AC

## 2024-08-15 MED ORDER — LEVETIRACETAM 500 MG PO TABS: 500.0000 mg | ORAL_TABLET | Freq: Two times a day (BID) | ORAL | 0 refills | Status: AC

## 2024-08-15 MED ORDER — DIVALPROEX SODIUM 250 MG PO DR TAB
500.0000 mg | DELAYED_RELEASE_TABLET | Freq: Once | ORAL | Status: DC
  Filled 2024-08-15: qty 2

## 2024-08-15 NOTE — Discharge Instructions (Signed)
 Please follow-up with your doctor or return to the emergency department in 7 days to have your sutures removed.  Please take your Depakote  as prescribed and return to the emergency department for worsening symptoms.

## 2024-08-15 NOTE — ED Provider Notes (Signed)
 [place] EMERGENCY DEPARTMENT AT [facility] Provider Note   CSN: [id number] Arrival date & time: 08/15/24  1109     Patient presents with: Seizures   MAVERIC [name] is a 33 y.o. male.   33 year old male with past medical history of seizures who is supposed be on Depakote  presenting to the emergency department today after an apparent seizure.  The patient did hit his head and did briefly lose consciousness.  Patient is unsure if he took his medication this morning.  Thinks that he did take it yesterday evening but is also unsure about that.  States that he is within his normal state of health before this occurred.  He is having a headache now but denies any other symptoms.   Seizures      Prior to Admission medications   Medication Sig Start Date End Date Taking? Authorizing Provider  divalproex  (DEPAKOTE ) 500 MG DR tablet Take 1 tablet (500 mg total) by mouth 2 (two) times daily. Take with 250 mg tablet for a total of 750 mg bid 03/01/24 08/15/24 Yes Henderly, Britni A, PA-C  levETIRAcetam  (KEPPRA ) 500 MG tablet Take 1 tablet (500 mg total) by mouth 2 (two) times daily. 08/15/24  Yes Ula Prentice SAUNDERS, MD    Allergies: Gadolinium, Gadolinium derivatives, Mushroom extract complex (obsolete), Fish allergy, Shellfish-derived products, Dust mite extract, Shellfish allergy, and Tomato    Review of Systems  Neurological:  Positive for seizures.  All other systems reviewed and are negative.   Updated Vital Signs BP 105/64   Pulse (!) 52   Temp 97.6 F (36.4 C) (Oral)   Resp 18   SpO2 100%   Physical Exam Vitals and nursing note reviewed.   Gen: NAD Eyes: PERRL, EOMI, 3.5 cm laceration noted over R eye, hemostatic HEENT: no oropharyngeal swelling Neck: trachea midline, no meningismus, no midline tenderness Resp: clear to auscultation bilaterally Card: RRR, no murmurs, rubs, or gallops Abd: nontender, nondistended Extremities: no calf tenderness, no edema Vascular: 2+  radial pulses bilaterally, 2+ DP pulses bilaterally Neuro: No focal deficits Skin: no rashes Psyc: acting appropriately   (all labs ordered are listed, but only abnormal results are displayed) Labs Reviewed  CBC WITH DIFFERENTIAL/PLATELET - Abnormal; Notable for the following components:      Result Value   WBC 3.2 (*)    Hemoglobin 12.4 (*)    MCH 24.8 (*)    All other components within normal limits  BASIC METABOLIC PANEL WITH GFR - Abnormal; Notable for the following components:   CO2 20 (*)    All other components within normal limits  VALPROIC  ACID LEVEL    EKG: None  Radiology: CT Cervical Spine Wo Contrast Result Date: 08/15/2024 EXAM: CT CERVICAL SPINE WITHOUT CONTRAST 08/15/2024 12:15:00 PM TECHNIQUE: CT of the cervical spine was performed without the administration of intravenous contrast. Multiplanar reformatted images are provided for review. Automated exposure control, iterative reconstruction, and/or weight based adjustment of the mA/kV was utilized to reduce the radiation dose to as low as reasonably achievable. COMPARISON: 10/27/2023 CLINICAL HISTORY: Bone lesion, cervical spine, incidental. Seizures; CT Head Wo Contrast; Head trauma, abnormal mental status (Age 1-64y); CT Cervical Spine Wo Contrast; Bone lesion, cervical spine, incidental; See ED Notes:; Pt was at [facility] when staff reported that he had a seizure and called EMS. Pt hit his head on the tile floor and has a head lac above his right eye and swelling below the right eye. Pt has high-functioning  autism. EMS reports pt has been postictal since their arrival. Pt is still postictal upon assessment and answering minimal questions. FINDINGS: CERVICAL SPINE: BONES AND ALIGNMENT: No acute fracture or traumatic malalignment. DEGENERATIVE CHANGES: No significant degenerative changes. SOFT TISSUES: No prevertebral soft tissue swelling. IMPRESSION: 1. No acute abnormality of the cervical spine.  Electronically signed by: Evalene Coho MD 08/15/2024 12:57 PM EDT RP Workstation: [id number]   CT Head Wo Contrast Result Date: 08/15/2024 EXAM: CT HEAD WITHOUT CONTRAST 08/15/2024 12:15:00 PM TECHNIQUE: CT of the head was performed without the administration of intravenous contrast. Automated exposure control, iterative reconstruction, and/or weight based adjustment of the mA/kV was utilized to reduce the radiation dose to as low as reasonably achievable. COMPARISON: CT of the head dated 10/27/2023. CLINICAL HISTORY: Head trauma, abnormal mental status (Age 25-64y). Seizures. Patient was at [facility] when staff reported that he had a seizure and called EMS. Patient hit his head on the tile floor and has a head laceration above his right eye and swelling below the right eye. Patient has high-functioning autism. EMS reports patient has been postictal since their arrival. Patient is still postictal upon assessment and answering minimal questions. FINDINGS: BRAIN AND VENTRICLES: No acute hemorrhage. Gray-white differentiation is preserved. No hydrocephalus. No extra-axial collection. No mass effect or midline shift. ORBITS: No acute abnormality. SINUSES: No acute abnormality. SOFT TISSUES AND SKULL: No acute soft tissue abnormality. No skull fracture. IMPRESSION: 1. No acute intracranial abnormality. Electronically signed by: Evalene Coho MD 08/15/2024 12:55 PM EDT RP Workstation: [id number]     .Laceration Repair  Date/Time: 08/15/2024 2:25 PM  Performed by: Ula Prentice SAUNDERS, MD Authorized by: Ula Prentice SAUNDERS, MD   Consent:    Consent obtained:  Verbal   Consent given by:  Patient   Risks discussed:  Infection, need for additional repair, nerve damage, poor wound healing, poor cosmetic result, pain and retained foreign body   Alternatives discussed:  No treatment Universal protocol:    Immediately prior to procedure, a time out was called: yes     Patient identity confirmed:   Verbally with patient Anesthesia:    Anesthesia method:  Local infiltration   Local anesthetic:  Lidocaine  2% WITH epi Laceration details:    Location:  Face   Face location:  R eyebrow   Length (cm):  3.5   Depth (mm):  4 Pre-procedure details:    Preparation:  Patient was prepped and draped in usual sterile fashion Exploration:    Limited defect created (wound extended): no     Hemostasis achieved with:  Epinephrine    Wound extent: fascia not violated, no foreign body, no signs of injury, no nerve damage, no tendon damage, no underlying fracture and no vascular damage   Treatment:    Area cleansed with:  Saline   Amount of cleaning:  Standard   Debridement:  None   Undermining:  None   Scar revision: no   Skin repair:    Repair method:  Sutures   Suture size:  6-0   Suture material:  Prolene   Suture technique:  Simple interrupted   Number of sutures:  5 Approximation:    Approximation:  Close Repair type:    Repair type:  Simple Post-procedure details:    Dressing:  Open (no dressing)   Procedure completion:  Tolerated well, no immediate complications    Medications Ordered in the ED  divalproex  (DEPAKOTE ) DR tablet 500 mg (500 mg Oral Patient Refused/Not Given 08/15/24 1250)  divalproex  (DEPAKOTE ) DR tablet 250 mg (has no administration in time range)  levETIRAcetam  (KEPPRA ) tablet 1,000 mg (has no administration in time range)  lidocaine -EPINEPHrine  (XYLOCAINE  W/EPI) 2 %-1:200000 (PF) injection 10 mL (10 mLs Infiltration Given 08/15/24 1432)                                    Medical Decision Making 67 male with past medical history of autism and seizure disorder presenting to emergency department today after a seizure.  It does appear the patient is seen here frequently for seizures.  Will obtain basic labs here to evaluate for electrolyte abnormalities as well as a Depakote  level.  Will give the patient oral Depakote  here.  Will obtain CT scan of his head to eval  for intracranial injury.  Will repair his laceration.  I will reevaluate for ultimate disposition.  The patient's work appears reassuring.  CT scans unremarkable.  Laceration was repaired.  Patient returns back to baseline.  He is discharged with return precautions.  The patient was therapeutic on his valproic  acid.  I did call and discussed this with Dr. Michaela.  Recommends starting Keppra .  This is ordered.  I have placed a referral to neurology as an outpatient for the patient as well.  He is discharged with return precautions.  Amount and/or Complexity of Data Reviewed Labs: ordered. Radiology: ordered.  Risk Prescription drug management.       Final diagnoses:  Seizure (HCC)  Eyebrow laceration, right, initial encounter    ED Discharge Orders          Ordered    levETIRAcetam  (KEPPRA ) 500 MG tablet  2 times daily        08/15/24 1456    Ambulatory referral to Neurology       Comments: An appointment is requested in approximately: 2 weeks   08/15/24 1457               Ula Prentice SAUNDERS, MD 08/15/24 1458

## 2024-08-15 NOTE — ED Notes (Signed)
 Pt removed own IV out of right hand. Pt very argumentative at this time. DC orders reviewed.

## 2024-08-15 NOTE — ED Triage Notes (Signed)
 Pt was at [facility] when staff reported that he had a seizure and called EMS. Pt hit his head on the tile floor and has a head lac above his right eye and swelling below the right eye.Pt has high-functioning autism. EMS reports pt has been postictal since their arrival. Pt is still postictal upon assessment and answering minimal questions.

## 2024-08-15 NOTE — ED Notes (Signed)
 Pt refused medication. This RN explained the risks and benefits of medication and the pt still refused. MD notified of refusal.

## 2024-08-23 ENCOUNTER — Emergency Department (HOSPITAL_COMMUNITY)
Admission: EM | Admit: 2024-08-23 | Discharge: 2024-08-23 | Disposition: A | Discharge status: Home / Self Care | Payer: Self-pay | Attending: Emergency Medicine | Admitting: Emergency Medicine

## 2024-08-23 ENCOUNTER — Other Ambulatory Visit: Payer: Self-pay

## 2024-08-23 ENCOUNTER — Encounter (HOSPITAL_COMMUNITY): Payer: Self-pay

## 2024-08-23 DIAGNOSIS — Z4802 Encounter for removal of sutures: Secondary | ICD-10-CM | POA: Insufficient documentation

## 2024-08-23 NOTE — ED Provider Notes (Signed)
 [place] EMERGENCY DEPARTMENT AT [facility] Provider Note   CSN: [id number] Arrival date & time: 08/23/24  1221     Patient presents with: Suture / Staple Removal   Joseph [name] is a 33 y.o. male with PMHx seizures who presents to ED concerned for suture removal. Patient sustained laceration to right eyebrow on 8/21. Patient states that he has been healing well.    Suture / Staple Removal       Prior to Admission medications   Medication Sig Start Date End Date Taking? Authorizing Provider  divalproex  (DEPAKOTE ) 500 MG DR tablet Take 1 tablet (500 mg total) by mouth 2 (two) times daily. Take with 250 mg tablet for a total of 750 mg bid 08/15/24 09/14/24  Ula Prentice SAUNDERS, MD  levETIRAcetam  (KEPPRA ) 500 MG tablet Take 1 tablet (500 mg total) by mouth 2 (two) times daily. 08/15/24   Ula Prentice SAUNDERS, MD    Allergies: Gadolinium, Gadolinium derivatives, Mushroom extract complex (obsolete), Fish allergy, Shellfish-derived products, Dust mite extract, Shellfish allergy, and Tomato    Review of Systems  Musculoskeletal:        Suture removal    Updated Vital Signs BP 127/71 (BP Location: Left Arm)   Pulse (!) 57   Temp (!) 97.5 F (36.4 C)   Resp 17   SpO2 100%   Physical Exam Vitals and nursing note reviewed.  Constitutional:      General: He is not in acute distress.    Appearance: He is not ill-appearing or toxic-appearing.  HENT:     Head: Normocephalic and atraumatic.  Eyes:     General: No scleral icterus.       Right eye: No discharge.        Left eye: No discharge.     Conjunctiva/sclera: Conjunctivae normal.  Cardiovascular:     Rate and Rhythm: Normal rate.  Pulmonary:     Effort: Pulmonary effort is normal.  Abdominal:     General: Abdomen is flat.  Skin:    General: Skin is warm and dry.     Comments: Laceration on right eyebrow appears clean, dry, and intact without concern for erythema, swelling, or increased warmth or purulence.   Neurological:     General: No focal deficit present.     Mental Status: He is alert. Mental status is at baseline.  Psychiatric:        Mood and Affect: Mood normal.        Behavior: Behavior normal.     (all labs ordered are listed, but only abnormal results are displayed) Labs Reviewed - No data to display  EKG: None  Radiology: No results found.   Procedures   Medications Ordered in the ED - No data to display                                  Medical Decision Making  This patient presents to the ED for concern of suture removal, this involves an extensive number of treatment options, and is a complaint that carries with it a high risk of complications and morbidity.  The differential diagnosis includes poor wound healing, infection, etc   Co morbidities that complicate the patient evaluation  seizure   Additional history obtained:  Additional history obtained from 8/21 ED note: patient with 5 sutures placed in right eyebrow    Problem List / ED Course / Critical interventions /  Medication management  Patient presents to ED for suture removal. Laceration appears well healed. 5 sutures were removed. Patient to follow up with PCP. I have reviewed the patients home medicines and have made adjustments as needed The patient has been appropriately medically screened and/or stabilized in the ED. I have low suspicion for any other emergent medical condition which would require further screening, evaluation or treatment in the ED or require inpatient management. At time of discharge the patient is hemodynamically stable and in no acute distress. I have discussed work-up results and diagnosis with patient and answered all questions. Patient is agreeable with discharge plan. We discussed strict return precautions for returning to the emergency department and they verbalized understanding.     Social Determinants of Health:  none      Final diagnoses:  Visit for  suture removal    ED Discharge Orders     None          Hoy Nidia FALCON, [place] 08/23/24 1243    Yolande Lamar BROCKS, MD 08/26/24 1231

## 2024-08-23 NOTE — Discharge Instructions (Signed)
 You will need to follow up with primary care. Seek emergency care if experiencing any new or worsening symptoms.

## 2024-08-23 NOTE — ED Triage Notes (Signed)
 PT arrives via POV. States he is here to have sutures removed from right eyebrow.
